# Patient Record
Sex: Male | Born: 1985 | Race: White | Hispanic: No | Marital: Single | Smoking: Current every day smoker
Health system: Southern US, Community
[De-identification: ages and names within clinical notes are randomized; demographics above are authoritative.]

## PROBLEM LIST (undated history)

## (undated) DIAGNOSIS — F209 Schizophrenia, unspecified: Secondary | ICD-10-CM

## (undated) DIAGNOSIS — F419 Anxiety disorder, unspecified: Secondary | ICD-10-CM

## (undated) DIAGNOSIS — F329 Major depressive disorder, single episode, unspecified: Secondary | ICD-10-CM

## (undated) DIAGNOSIS — F32A Depression, unspecified: Secondary | ICD-10-CM

## (undated) DIAGNOSIS — E785 Hyperlipidemia, unspecified: Secondary | ICD-10-CM

---
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
## 2007-07-11 ENCOUNTER — Emergency Department: Payer: Self-pay | Admitting: Emergency Medicine

## 2011-03-10 ENCOUNTER — Emergency Department: Payer: Self-pay | Admitting: Emergency Medicine

## 2011-03-10 LAB — ETHANOL: Ethanol %: 0.166 % — ABNORMAL HIGH (ref 0.000–0.080)

## 2011-03-10 LAB — COMPREHENSIVE METABOLIC PANEL
Albumin: 4.1 g/dL (ref 3.4–5.0)
Alkaline Phosphatase: 51 U/L (ref 50–136)
BUN: 12 mg/dL (ref 7–18)
Bilirubin,Total: 0.3 mg/dL (ref 0.2–1.0)
Creatinine: 0.86 mg/dL (ref 0.60–1.30)
EGFR (African American): >60
Glucose: 92 mg/dL (ref 65–99)
Osmolality: 284 (ref 275–301)
SGPT (ALT): 57 U/L
Sodium: 143 mmol/L (ref 136–145)
Total Protein: 7 g/dL (ref 6.4–8.2)

## 2011-03-10 LAB — CBC WITH DIFFERENTIAL/PLATELET
Basophil %: 0.4 %
Eosinophil #: 0.2 10*3/uL (ref 0.0–0.7)
Eosinophil %: 1.5 %
HCT: 42.3 % (ref 40.0–52.0)
Lymphocyte #: 1.7 10*3/uL (ref 1.0–3.6)
MCH: 30.3 pg (ref 26.0–34.0)
MCHC: 34 g/dL (ref 32.0–36.0)
MCV: 89 fL (ref 80–100)
Monocyte #: 0.7 10*3/uL (ref 0.0–0.7)
Neutrophil %: 76.8 %
Platelet: 143 10*3/uL — ABNORMAL LOW (ref 150–440)
RBC: 4.74 10*6/uL (ref 4.40–5.90)

## 2011-03-10 LAB — DRUG SCREEN, URINE
Barbiturates, Ur Screen: NEGATIVE (ref ?–200)
Benzodiazepine, Ur Scrn: NEGATIVE (ref ?–200)
Cannabinoid 50 Ng, Ur ~~LOC~~: NEGATIVE (ref ?–50)
MDMA (Ecstasy)Ur Screen: NEGATIVE (ref ?–500)
Methadone, Ur Screen: NEGATIVE (ref ?–300)
Opiate, Ur Screen: NEGATIVE (ref ?–300)
Phencyclidine (PCP) Ur S: NEGATIVE (ref ?–25)

## 2011-03-14 ENCOUNTER — Emergency Department: Payer: Self-pay | Admitting: Emergency Medicine

## 2011-03-14 LAB — CBC
HGB: 15.1 g/dL (ref 13.0–18.0)
MCV: 90 fL (ref 80–100)
Platelet: 158 10*3/uL (ref 150–440)
RBC: 4.95 10*6/uL (ref 4.40–5.90)
RDW: 13.7 % (ref 11.5–14.5)
WBC: 10.8 10*3/uL — ABNORMAL HIGH (ref 3.8–10.6)

## 2011-03-14 LAB — COMPREHENSIVE METABOLIC PANEL
Alkaline Phosphatase: 62 U/L (ref 50–136)
Bilirubin,Total: 0.3 mg/dL (ref 0.2–1.0)
Co2: 25 mmol/L (ref 21–32)
EGFR (Non-African Amer.): >60
Glucose: 79 mg/dL (ref 65–99)
Osmolality: 284 (ref 275–301)
SGOT(AST): 40 U/L — ABNORMAL HIGH (ref 15–37)
SGPT (ALT): 46 U/L
Total Protein: 7.4 g/dL (ref 6.4–8.2)

## 2011-03-14 LAB — ETHANOL
Ethanol %: <0.003 % (ref 0.000–0.080)
Ethanol: <3 mg/dL

## 2011-03-14 LAB — DRUG SCREEN, URINE
Amphetamines, Ur Screen: NEGATIVE (ref ?–1000)
Barbiturates, Ur Screen: NEGATIVE (ref ?–200)
Benzodiazepine, Ur Scrn: NEGATIVE (ref ?–200)
Methadone, Ur Screen: NEGATIVE (ref ?–300)
Phencyclidine (PCP) Ur S: NEGATIVE (ref ?–25)
Tricyclic, Ur Screen: NEGATIVE (ref ?–1000)

## 2011-03-14 LAB — TSH: Thyroid Stimulating Horm: 4.33 u[IU]/mL

## 2011-03-15 LAB — URINALYSIS, COMPLETE
Bacteria: NONE SEEN
Bilirubin,UR: NEGATIVE
Glucose,UR: NEGATIVE mg/dL (ref 0–75)
Ketone: NEGATIVE
Leukocyte Esterase: NEGATIVE
RBC,UR: <1 /HPF (ref 0–5)
Squamous Epithelial: NONE SEEN
WBC UR: NONE SEEN /HPF (ref 0–5)

## 2012-07-17 ENCOUNTER — Ambulatory Visit: Payer: Self-pay | Admitting: Internal Medicine

## 2014-03-02 DIAGNOSIS — R7989 Other specified abnormal findings of blood chemistry: Secondary | ICD-10-CM | POA: Insufficient documentation
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
## 2014-05-03 NOTE — Consult Note (Signed)
Pt is [facility]  Full dictation and orders to follow.   Electronic Signatures: Lester CarolinaWilliford, Rande Roylance S (MD) (Signed on 06-Mar-13 15:10)  Authored   Last Updated: 06-Mar-13 23:51 by Lester CarolinaWilliford, Phelan Schadt S (MD)
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
## 2014-11-03 IMAGING — US ABDOMEN ULTRASOUND LIMITED
1 series · 14 of 25 positions shown · non-contrast
Comparison: 

REASON FOR EXAM: RUQ  liver elevated liver enzymes
COMMENTS:

PROCEDURE:     US  - US ABDOMEN LIMITED SURVEY  - July 17, 2012 [DATE]
RESULT:     Right or quadrant abdominal ultrasound dated
TECHNIQUE: Real time sonographic imaging of the right quadrant was obtained.
Static representative images were provided for interpretation.

[Series 1: abdomen ultrasound limited · 0.33mm/px · 14 of 56 slices shown]
[im 1/56]
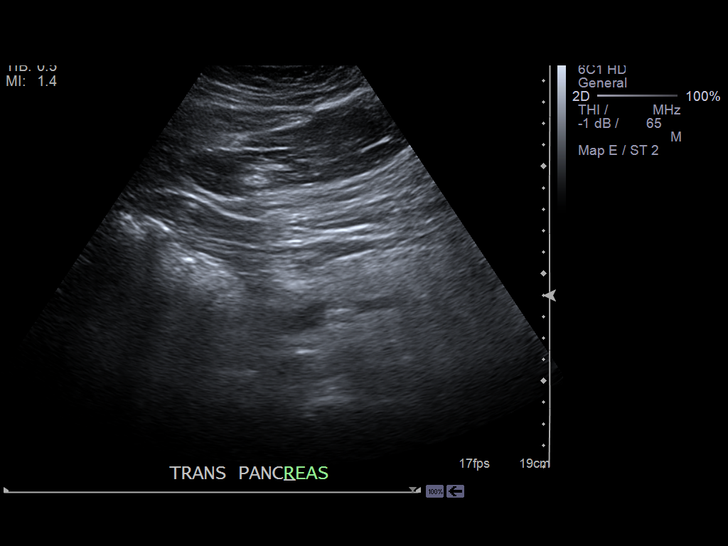
[im 5/56]
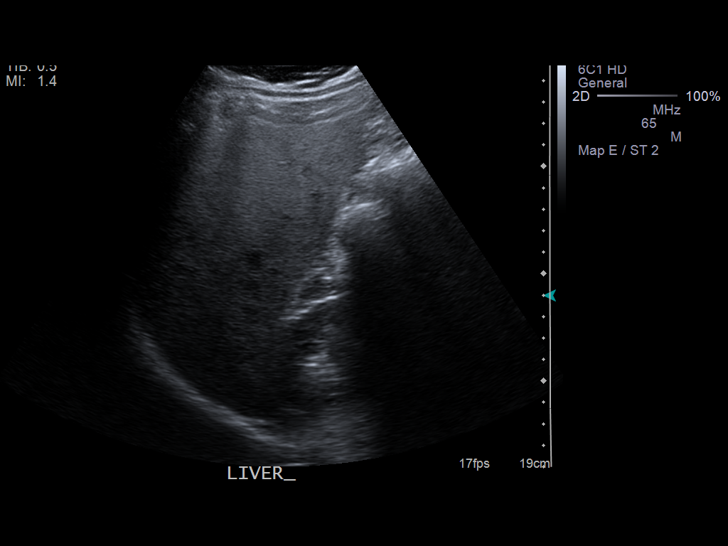
[im 10/56]
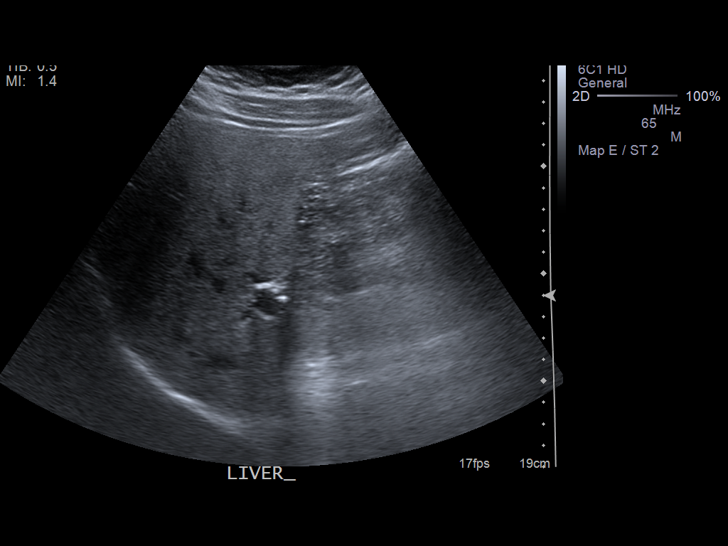
[im 14/56]
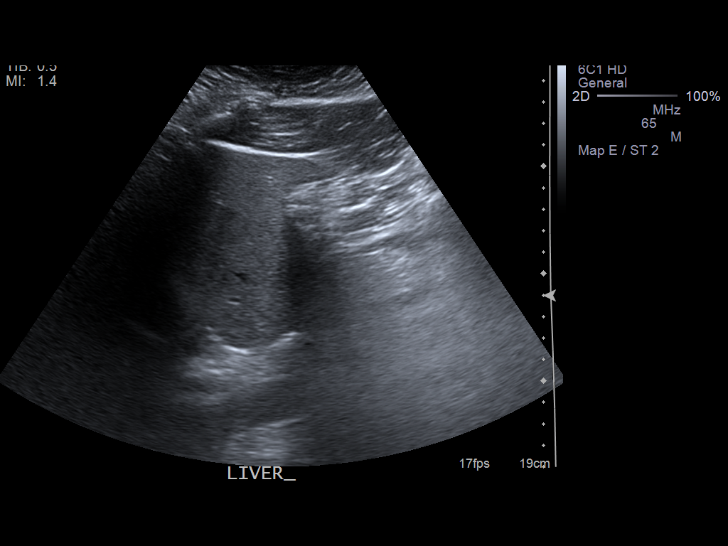
[im 19/56]
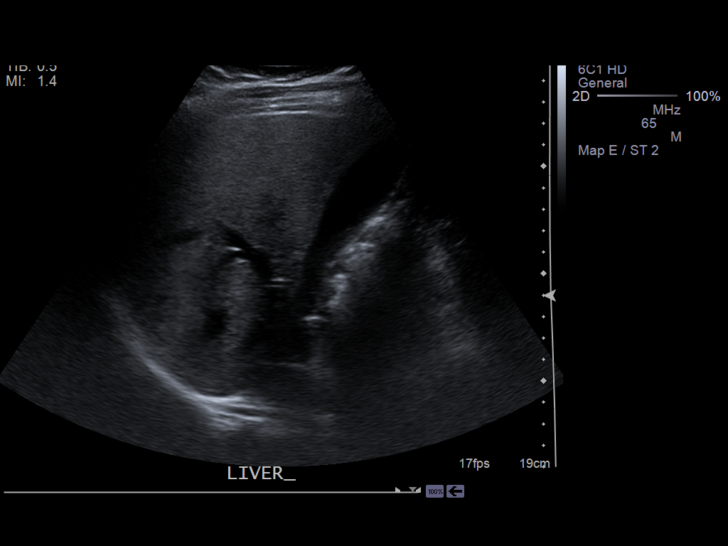
[im 21/56]
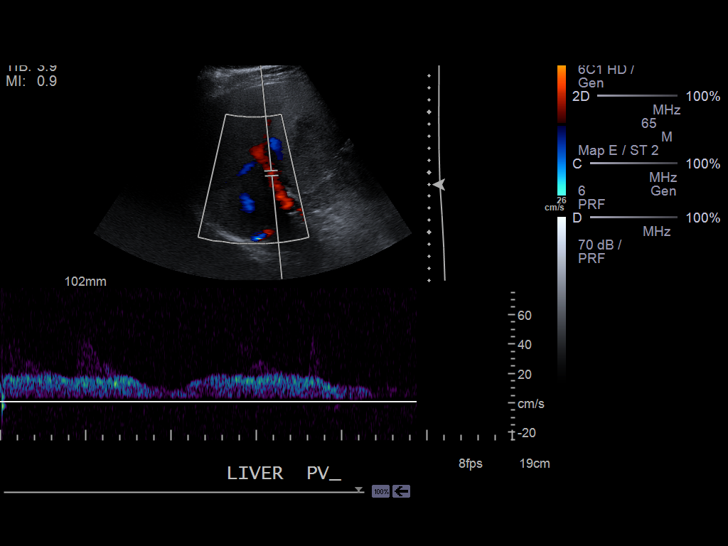
[im 26/56]
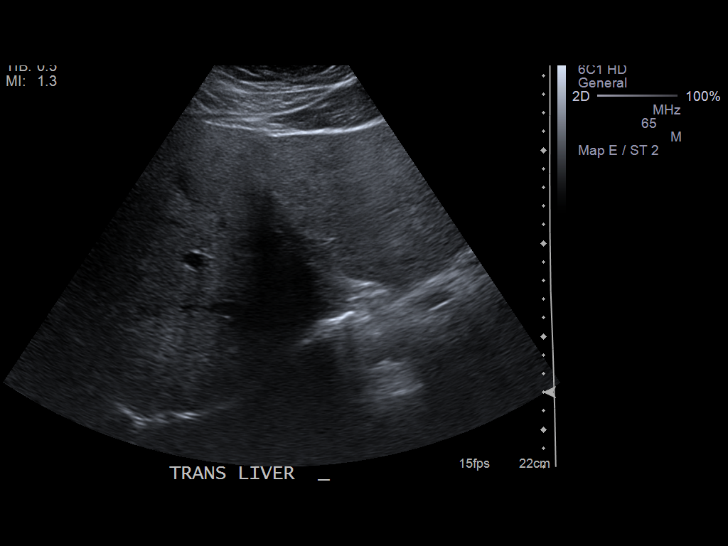
[im 30/56]
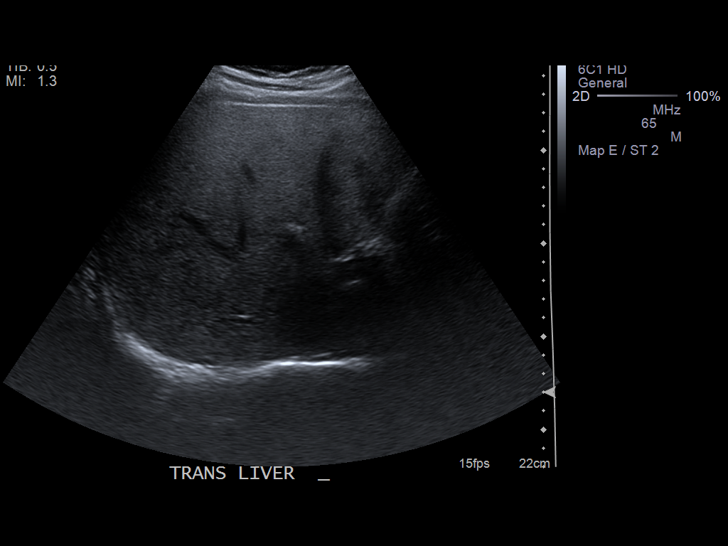
[im 35/56]
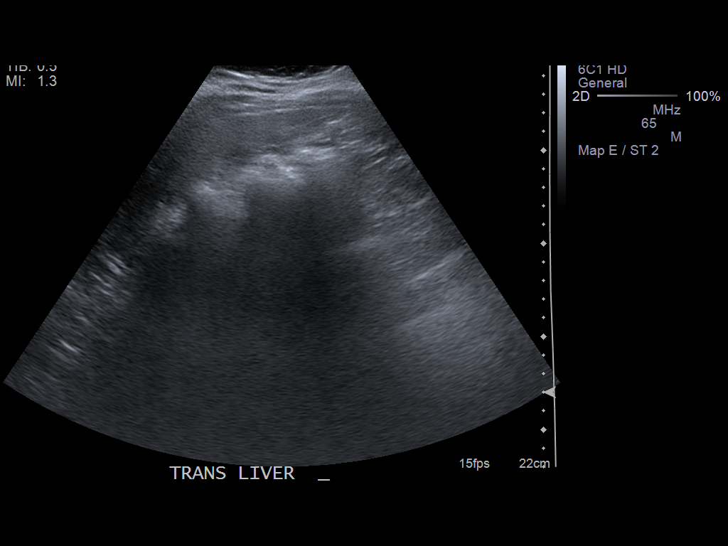
[im 37/56]
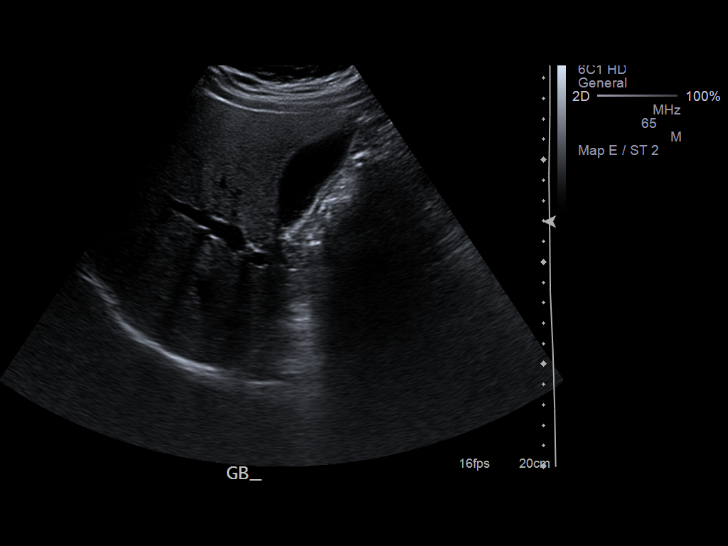
[im 42/56]
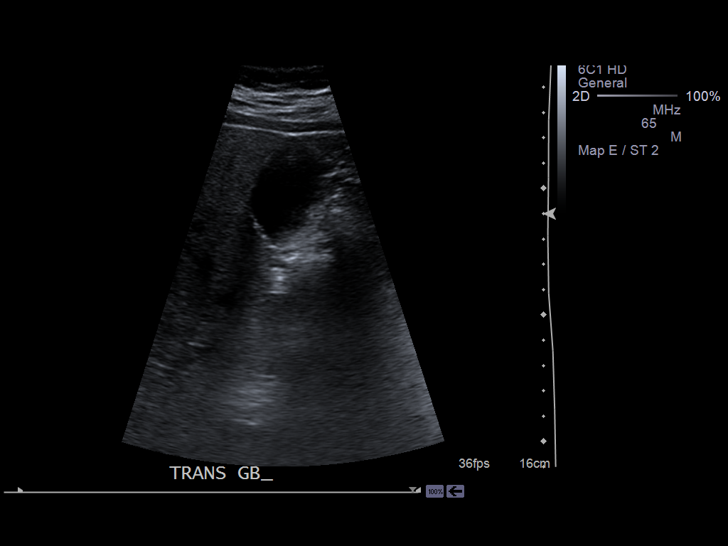
[im 46/56]
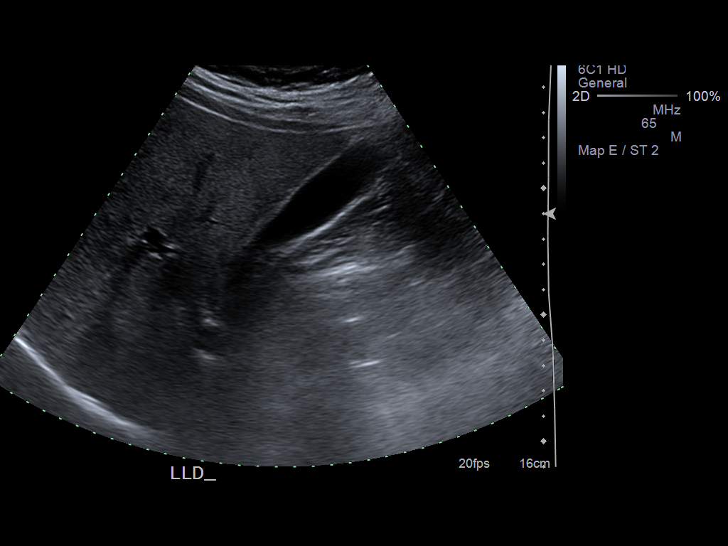
[im 51/56]
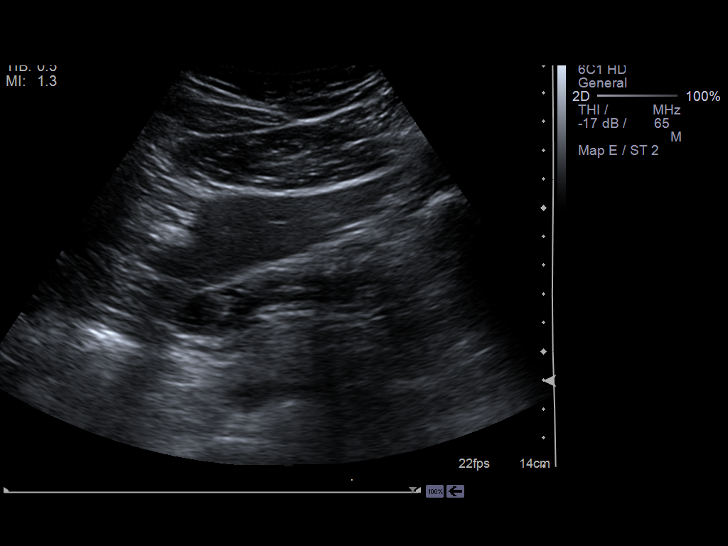
[im 56/56]
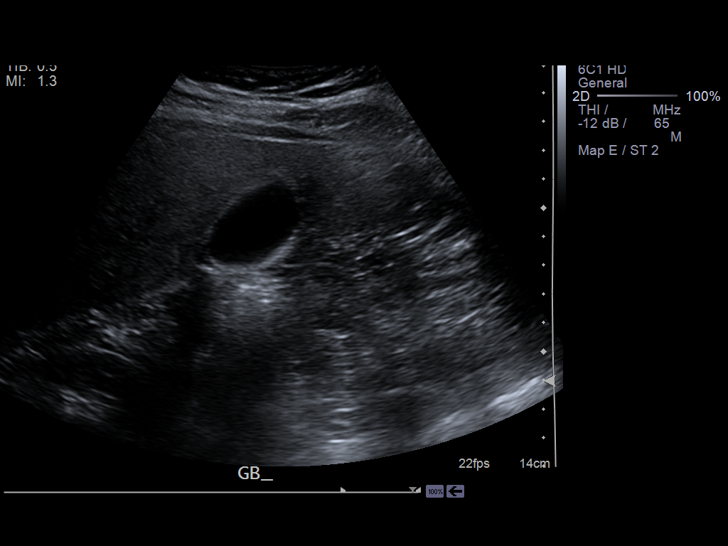

[14 of 25 positions shown; findings below may reference images not displayed]

FINDINGS: The liver is unremarkable. Hepatopedal flow is identified within
the portal vein.

There is no evidence of pericholecystic fluid, gallstones, sludging,
gallbladder wall thickening, nor common bile duct dilation. The gallbladder
wall thickness is  1.7 mm and the common bile duct measures  3.4 mm in
diameter. The technologist was unable to elicit a sonographic Murphy's sign.
The pancreas is unremarkable.
IMPRESSION: No sonographic evidence of cholecystitis nor cholelithiasis.

## 2015-08-24 ENCOUNTER — Emergency Department
Admission: EM | Admit: 2015-08-24 | Discharge: 2015-08-24 | Disposition: A | Discharge status: Home / Self Care | Payer: No Typology Code available for payment source | Attending: Emergency Medicine | Admitting: Emergency Medicine

## 2015-08-24 ENCOUNTER — Emergency Department: Payer: No Typology Code available for payment source

## 2015-08-24 ENCOUNTER — Encounter: Payer: Self-pay | Admitting: Emergency Medicine

## 2015-08-24 DIAGNOSIS — Y9355 Activity, bike riding: Secondary | ICD-10-CM | POA: Diagnosis not present

## 2015-08-24 DIAGNOSIS — F17200 Nicotine dependence, unspecified, uncomplicated: Secondary | ICD-10-CM | POA: Insufficient documentation

## 2015-08-24 DIAGNOSIS — S60021A Contusion of right index finger without damage to nail, initial encounter: Secondary | ICD-10-CM | POA: Diagnosis not present

## 2015-08-24 DIAGNOSIS — Y92410 Unspecified street and highway as the place of occurrence of the external cause: Secondary | ICD-10-CM | POA: Insufficient documentation

## 2015-08-24 DIAGNOSIS — Y999 Unspecified external cause status: Secondary | ICD-10-CM | POA: Insufficient documentation

## 2015-08-24 DIAGNOSIS — S6991XA Unspecified injury of right wrist, hand and finger(s), initial encounter: Secondary | ICD-10-CM | POA: Diagnosis present

## 2015-08-24 DIAGNOSIS — S6000XA Contusion of unspecified finger without damage to nail, initial encounter: Secondary | ICD-10-CM

## 2015-08-24 MED ORDER — IBUPROFEN 800 MG PO TABS: 800.0000 mg | ORAL_TABLET | Freq: Three times a day (TID) | ORAL | 0 refills | Status: DC | PRN
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
## 2019-04-10 DIAGNOSIS — F250 Schizoaffective disorder, bipolar type: Secondary | ICD-10-CM | POA: Insufficient documentation

## 2019-04-10 DIAGNOSIS — J449 Chronic obstructive pulmonary disease, unspecified: Secondary | ICD-10-CM | POA: Insufficient documentation

## 2019-04-10 DIAGNOSIS — F418 Other specified anxiety disorders: Secondary | ICD-10-CM | POA: Insufficient documentation

## 2019-04-10 DIAGNOSIS — F259 Schizoaffective disorder, unspecified: Secondary | ICD-10-CM | POA: Insufficient documentation

## 2019-04-10 DIAGNOSIS — K760 Fatty (change of) liver, not elsewhere classified: Secondary | ICD-10-CM | POA: Insufficient documentation

## 2019-04-10 DIAGNOSIS — E785 Hyperlipidemia, unspecified: Secondary | ICD-10-CM | POA: Insufficient documentation

## 2019-04-10 DIAGNOSIS — G4733 Obstructive sleep apnea (adult) (pediatric): Secondary | ICD-10-CM | POA: Insufficient documentation

## 2019-04-11 ENCOUNTER — Inpatient Hospital Stay: Payer: Medicare Other | Attending: Oncology | Admitting: Oncology

## 2019-04-11 ENCOUNTER — Encounter: Payer: Self-pay | Admitting: Oncology

## 2019-04-11 ENCOUNTER — Other Ambulatory Visit: Payer: Self-pay

## 2019-04-11 ENCOUNTER — Inpatient Hospital Stay: Payer: Medicare Other

## 2019-04-11 ENCOUNTER — Encounter (INDEPENDENT_AMBULATORY_CARE_PROVIDER_SITE_OTHER): Payer: Self-pay

## 2019-04-11 VITALS — BP 126/76 | HR 88 | Temp 97.8°F | Resp 20 | Wt 321.4 lb

## 2019-04-11 DIAGNOSIS — D72829 Elevated white blood cell count, unspecified: Secondary | ICD-10-CM | POA: Diagnosis present

## 2019-04-11 DIAGNOSIS — F17200 Nicotine dependence, unspecified, uncomplicated: Secondary | ICD-10-CM | POA: Insufficient documentation

## 2019-04-11 DIAGNOSIS — Z79899 Other long term (current) drug therapy: Secondary | ICD-10-CM | POA: Insufficient documentation

## 2019-04-11 DIAGNOSIS — R0609 Other forms of dyspnea: Secondary | ICD-10-CM | POA: Diagnosis not present

## 2019-04-11 DIAGNOSIS — R7889 Finding of other specified substances, not normally found in blood: Secondary | ICD-10-CM | POA: Diagnosis not present

## 2019-04-11 DIAGNOSIS — D751 Secondary polycythemia: Secondary | ICD-10-CM | POA: Diagnosis not present

## 2019-04-11 DIAGNOSIS — R0602 Shortness of breath: Secondary | ICD-10-CM | POA: Insufficient documentation

## 2019-04-11 LAB — FERRITIN: Ferritin: 297 ng/mL (ref 24–336)

## 2019-04-11 LAB — IRON AND TIBC
Iron: 107 ug/dL (ref 45–182)
Saturation Ratios: 27 % (ref 17.9–39.5)
TIBC: 391 ug/dL (ref 250–450)
UIBC: 284 ug/dL

## 2019-04-11 LAB — CBC
HCT: 49.9 % (ref 39.0–52.0)
Hemoglobin: 17.6 g/dL — ABNORMAL HIGH (ref 13.0–17.0)
MCH: 31.3 pg (ref 26.0–34.0)
MCHC: 35.3 g/dL (ref 30.0–36.0)
MCV: 88.6 fL (ref 80.0–100.0)
Platelets: 163 10*3/uL (ref 150–400)
RBC: 5.63 MIL/uL (ref 4.22–5.81)
RDW: 12.2 % (ref 11.5–15.5)
WBC: 11.2 10*3/uL — ABNORMAL HIGH (ref 4.0–10.5)
nRBC: 0 % (ref 0.0–0.2)

## 2019-04-11 NOTE — Progress Notes (Signed)
[place]  Telephone:(336) [phone] Fax:(336) [phone]  ID: Jeffrey Velasquez OB: [birth year]  MR#: [id number]  IOE#:[id number]  Patient Care Team: Jodi Marble, MD as PCP - General (Internal Medicine)  CHIEF COMPLAINT: Polycythemia, leukocytosis.  INTERVAL HISTORY: Patient is a 34 year old male who was noted to have a hemoglobin that was trending up as well as a persistently elevated white blood cell count.  He has chronic shortness of breath and dyspnea on exertion, but otherwise feels well.  He has no neurologic complaints.  He denies any recent fevers or illnesses.  He has a good appetite and denies weight loss.  He has no chest pain, cough, or hemoptysis.  He denies any nausea, vomiting, constipation, or diarrhea.  He has no urinary complaints.  Patient feels at his baseline and offers no further specific complaints today.  REVIEW OF SYSTEMS:   Review of Systems  Constitutional: Negative.  Negative for fever, malaise/fatigue and weight loss.  Respiratory: Positive for shortness of breath. Negative for cough and hemoptysis.   Cardiovascular: Negative.  Negative for chest pain and leg swelling.  Gastrointestinal: Negative.  Negative for abdominal pain, blood in stool and melena.  Genitourinary: Negative.  Negative for dysuria.  Musculoskeletal: Negative.   Skin: Negative.  Negative for rash.  Neurological: Negative.  Negative for dizziness, focal weakness, weakness and headaches.  Psychiatric/Behavioral: Negative.  The patient is not nervous/anxious.     As per HPI. Otherwise, a complete review of systems is negative.  PAST MEDICAL HISTORY: History reviewed. No pertinent past medical history.  PAST SURGICAL HISTORY: History reviewed. No pertinent surgical history.  FAMILY HISTORY: History reviewed. No pertinent family history.  ADVANCED DIRECTIVES (Y/N):  N  HEALTH MAINTENANCE: Social History   Tobacco Use  . Smoking status: Current Every  Day Smoker  . Smokeless tobacco: Never Used  Substance Use Topics  . Alcohol use: Yes  . Drug use: Not on file     Colonoscopy:  PAP:  Bone density:  Lipid panel:  Allergies  Allergen Reactions  . Clonazepam   . Shellfish Allergy     Current Outpatient Medications  Medication Sig Dispense Refill  . albuterol (VENTOLIN HFA) 108 (90 Base) MCG/ACT inhaler Inhale 2 puffs into the lungs every 4 (four) hours as needed.    . ARIPiprazole ER (ABILIFY MAINTENA) 300 MG PRSY prefilled syringe Inject 300 mg into the muscle every 30 (thirty) days.    . cetirizine (ZYRTEC) 10 MG tablet Take 10 mg by mouth daily.    . Cholecalciferol (VITAMIN D3) 1.25 MG (50000 UT) CAPS Take 1 capsule by mouth once a week.    . hydrOXYzine (VISTARIL) 50 MG capsule Take 50 mg by mouth 3 (three) times daily.    Marland Kitchen icosapent Ethyl (VASCEPA) 1 g capsule Take 2 g by mouth 2 (two) times daily.    . rosuvastatin (CRESTOR) 20 MG tablet Take 20 mg by mouth daily.    . budesonide-formoterol (SYMBICORT) 160-4.5 MCG/ACT inhaler Inhale 2 puffs into the lungs 2 (two) times daily.     No current facility-administered medications for this visit.    OBJECTIVE: Vitals:   04/11/19 1121  BP: 126/76  Pulse: 88  Resp: 20  Temp: 97.8 F (36.6 C)  SpO2: 98%     Body mass index is 44.83 kg/m.    ECOG FS:0 - Asymptomatic  General: Well-developed, well-nourished, no acute distress. Eyes: Pink conjunctiva, anicteric sclera. HEENT: Normocephalic, moist mucous membranes. Lungs: No audible wheezing or coughing. Heart:  Regular rate and rhythm. Abdomen: Soft, nontender, no obvious distention. Musculoskeletal: No edema, cyanosis, or clubbing. Neuro: Alert, answering all questions appropriately. Cranial nerves grossly intact. Skin: No rashes or petechiae noted. Psych: Normal affect. Lymphatics: No cervical, calvicular, axillary or inguinal LAD.   LAB RESULTS:  Lab Results  Component Value Date   NA 143 03/14/2011   K 3.8  03/14/2011   CL 107 03/14/2011   CO2 25 03/14/2011   GLUCOSE 79 03/14/2011   BUN 12 03/14/2011   CREATININE 1.02 03/14/2011   CALCIUM 9.0 03/14/2011   PROT 7.4 03/14/2011   ALBUMIN 4.2 03/14/2011   AST 40 (H) 03/14/2011   ALT 46 03/14/2011   ALKPHOS 62 03/14/2011   BILITOT 0.3 03/14/2011   GFRNONAA >60 03/14/2011   GFRAA >60 03/14/2011    Lab Results  Component Value Date   WBC 11.2 (H) 04/11/2019   NEUTROABS 8.8 (H) 03/10/2011   HGB 17.6 (H) 04/11/2019   HCT 49.9 04/11/2019   MCV 88.6 04/11/2019   PLT 163 04/11/2019     STUDIES: No results found.  ASSESSMENT: Polycythemia, leukocytosis.  PLAN:    1.  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  Iron panel, erythropoietin level, JAK2 mutation, carbon monoxide level, and hemochromatosis mutation have been ordered for completeness and are pending at time of dictation.  Given patient's hemoglobin is greater than 17.0, he will return to clinic in 2 weeks for discussion of his laboratory work and initiation of phlebotomy. 2.  Leukocytosis: Chronic and unchanged.  Laboratory work as above.  In addition flow cytometry as well as BCR-ABL mutation are pending at time of dictation.  No bone marrow biopsy is needed at this time.  Follow-up as above.  I spent a total of 45 minutes reviewing chart data, face-to-face evaluation with the patient, counseling and coordination of care as detailed above.   Patient expressed understanding and was in agreement with this plan. He also understands that He can call clinic at any time with any questions, concerns, or complaints.    Lloyd Huger, MD   04/11/2019 1:49 PM

## 2019-04-11 NOTE — Progress Notes (Signed)
New patient denies pain and would like to discuss recent diagnosis. States he has some swelling in ankles and is a current every day smoker.

## 2019-04-12 LAB — ERYTHROPOIETIN: Erythropoietin: 5.8 m[IU]/mL (ref 2.6–18.5)

## 2019-04-15 LAB — COMP PANEL: LEUKEMIA/LYMPHOMA

## 2019-04-15 LAB — CARBON MONOXIDE, BLOOD (PERFORMED AT REF LAB): Carbon Monoxide, Blood: 10.2 % — ABNORMAL HIGH (ref 0.0–3.6)

## 2019-04-15 LAB — JAK2 GENOTYPR

## 2019-04-16 LAB — HEMOCHROMATOSIS DNA-PCR(C282Y,H63D)

## 2019-04-17 LAB — BCR-ABL1, CML/ALL, PCR, QUANT: Interpretation (BCRAL):: NEGATIVE

## 2019-04-18 NOTE — Progress Notes (Signed)
[place]  Telephone:(336) [phone] Fax:(336) [phone]  ID: MOHD CLEMONS OB: 1985-01-20  MR#: [id number]  AOZ#:[id number]  Patient Care Team: Jodi Marble, MD as PCP - General (Internal Medicine)  CHIEF COMPLAINT: Polycythemia, leukocytosis.  INTERVAL HISTORY: Patient returns to clinic today for further evaluation, discussion of his laboratory results, and initiation of phlebotomy.  He continues to have chronic shortness of breath and dyspnea on exertion, but otherwise feels well.  He has no neurologic complaints.  He denies any recent fevers or illnesses.  He has a good appetite and denies weight loss.  He has no chest pain, cough, or hemoptysis.  He denies any nausea, vomiting, constipation, or diarrhea.  He has no urinary complaints.  Patient offers no further specific complaints today.  REVIEW OF SYSTEMS:   Review of Systems  Constitutional: Negative.  Negative for fever, malaise/fatigue and weight loss.  Respiratory: Positive for shortness of breath. Negative for cough and hemoptysis.   Cardiovascular: Negative.  Negative for chest pain and leg swelling.  Gastrointestinal: Negative.  Negative for abdominal pain, blood in stool and melena.  Genitourinary: Negative.  Negative for dysuria.  Musculoskeletal: Negative.   Skin: Negative.  Negative for rash.  Neurological: Negative.  Negative for dizziness, focal weakness, weakness and headaches.  Psychiatric/Behavioral: Negative.  The patient is not nervous/anxious.     As per HPI. Otherwise, a complete review of systems is negative.  PAST MEDICAL HISTORY: History reviewed. No pertinent past medical history.  PAST SURGICAL HISTORY: History reviewed. No pertinent surgical history.  FAMILY HISTORY: History reviewed. No pertinent family history.  ADVANCED DIRECTIVES (Y/N):  N  HEALTH MAINTENANCE: Social History   Tobacco Use  . Smoking status: Current Every Day Smoker  . Smokeless tobacco:  Never Used  Substance Use Topics  . Alcohol use: Yes  . Drug use: Not on file     Colonoscopy:  PAP:  Bone density:  Lipid panel:  Allergies  Allergen Reactions  . Clonazepam   . Shellfish Allergy     Current Outpatient Medications  Medication Sig Dispense Refill  . albuterol (VENTOLIN HFA) 108 (90 Base) MCG/ACT inhaler Inhale 2 puffs into the lungs every 4 (four) hours as needed.    . ARIPiprazole ER (ABILIFY MAINTENA) 300 MG PRSY prefilled syringe Inject 300 mg into the muscle every 30 (thirty) days.    . budesonide-formoterol (SYMBICORT) 160-4.5 MCG/ACT inhaler Inhale 2 puffs into the lungs 2 (two) times daily.    . cetirizine (ZYRTEC) 10 MG tablet Take 10 mg by mouth daily.    . Cholecalciferol (VITAMIN D3) 1.25 MG (50000 UT) CAPS Take 1 capsule by mouth once a week.    . hydrOXYzine (VISTARIL) 50 MG capsule Take 50 mg by mouth 3 (three) times daily.    Marland Kitchen icosapent Ethyl (VASCEPA) 1 g capsule Take 2 g by mouth 2 (two) times daily.    . rosuvastatin (CRESTOR) 20 MG tablet Take 20 mg by mouth daily.     No current facility-administered medications for this visit.    OBJECTIVE: Vitals:   04/25/19 1412  BP: 122/70  Pulse: 78  Resp: 20  Temp: 97.6 F (36.4 C)  SpO2: 98%     Body mass index is 44.81 kg/m.    ECOG FS:0 - Asymptomatic  General: Well-developed, well-nourished, no acute distress. Eyes: Pink conjunctiva, anicteric sclera. HEENT: Normocephalic, moist mucous membranes. Lungs: No audible wheezing or coughing. Heart: Regular rate and rhythm. Abdomen: Soft, nontender, no obvious distention. Musculoskeletal: No  edema, cyanosis, or clubbing. Neuro: Alert, answering all questions appropriately. Cranial nerves grossly intact. Skin: No rashes or petechiae noted. Psych: Normal affect.   LAB RESULTS:  Lab Results  Component Value Date   NA 143 03/14/2011   K 3.8 03/14/2011   CL 107 03/14/2011   CO2 25 03/14/2011   GLUCOSE 79 03/14/2011   BUN 12 03/14/2011    CREATININE 1.02 03/14/2011   CALCIUM 9.0 03/14/2011   PROT 7.4 03/14/2011   ALBUMIN 4.2 03/14/2011   AST 40 (H) 03/14/2011   ALT 46 03/14/2011   ALKPHOS 62 03/14/2011   BILITOT 0.3 03/14/2011   GFRNONAA >60 03/14/2011   GFRAA >60 03/14/2011    Lab Results  Component Value Date   WBC 11.2 (H) 04/11/2019   NEUTROABS 8.8 (H) 03/10/2011   HGB 17.6 (H) 04/11/2019   HCT 49.9 04/11/2019   MCV 88.6 04/11/2019   PLT 163 04/11/2019     STUDIES: No results found.  ASSESSMENT: Polycythemia, leukocytosis.  PLAN:    1.  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  Patient's carbon monoxide level is greater than 10%.  All of his other laboratory work including iron panel, erythropoietin level, JAK2 mutation and hemochromatosis mutation are all either negative or within normal limits.  Goal hemoglobin will be less than 17.0.  Proceed with phlebotomy today.  Return to clinic in 2 months with laboratory work and phlebotomy if needed and then in 4 months with laboratory work, further evaluation, and continuation of treatment.  2.  Leukocytosis: Chronic and unchanged.  Also likely secondary to chronic tobacco use.  Peripheral blood flow cytometry and BCR-ABL mutation are negative.  Patient does not require bone marrow biopsy at this time.  Monitor.    I spent a total of 30 minutes reviewing chart data, face-to-face evaluation with the patient, counseling and coordination of care as detailed above.   Patient expressed understanding and was in agreement with this plan. He also understands that He can call clinic at any time with any questions, concerns, or complaints.    Lloyd Huger, MD   04/26/2019 12:27 PM

## 2019-04-25 ENCOUNTER — Inpatient Hospital Stay: Payer: Medicare Other

## 2019-04-25 ENCOUNTER — Other Ambulatory Visit: Payer: Self-pay

## 2019-04-25 ENCOUNTER — Inpatient Hospital Stay (HOSPITAL_BASED_OUTPATIENT_CLINIC_OR_DEPARTMENT_OTHER): Payer: Medicare Other | Admitting: Oncology

## 2019-04-25 ENCOUNTER — Encounter: Payer: Self-pay | Admitting: Oncology

## 2019-04-25 VITALS — BP 122/70 | HR 78 | Temp 97.6°F | Resp 20 | Wt 321.3 lb

## 2019-04-25 DIAGNOSIS — D72829 Elevated white blood cell count, unspecified: Secondary | ICD-10-CM

## 2019-04-25 DIAGNOSIS — D751 Secondary polycythemia: Secondary | ICD-10-CM

## 2019-04-25 NOTE — Progress Notes (Signed)
Pt here for phlebotomy per Dr. Orlie Dakin.  PIV inserted to right AC with brisk blood return.  of blood collected from patient per MD orders.  Pt tolerated procedure well and drank sprite afterwards.  Left chemo suite stable and ambulatory.

## 2019-04-25 NOTE — Progress Notes (Signed)
Patient states he is not having any pain or concerns today. Patient states that he is trying to increase physical activity more. He states his swelling in feet has gotten some better.

## 2019-06-24 ENCOUNTER — Other Ambulatory Visit: Payer: Self-pay

## 2019-06-24 ENCOUNTER — Encounter: Payer: Self-pay | Admitting: Emergency Medicine

## 2019-06-24 ENCOUNTER — Emergency Department
Admission: EM | Admit: 2019-06-24 | Discharge: 2019-06-25 | Disposition: A | Discharge status: Home / Self Care | Payer: Medicare Other | Attending: Emergency Medicine | Admitting: Emergency Medicine

## 2019-06-24 DIAGNOSIS — F10129 Alcohol abuse with intoxication, unspecified: Secondary | ICD-10-CM | POA: Diagnosis not present

## 2019-06-24 DIAGNOSIS — Y900 Blood alcohol level of less than 20 mg/100 ml: Secondary | ICD-10-CM | POA: Diagnosis not present

## 2019-06-24 DIAGNOSIS — J449 Chronic obstructive pulmonary disease, unspecified: Secondary | ICD-10-CM | POA: Diagnosis present

## 2019-06-24 DIAGNOSIS — F259 Schizoaffective disorder, unspecified: Secondary | ICD-10-CM | POA: Diagnosis present

## 2019-06-24 DIAGNOSIS — R7989 Other specified abnormal findings of blood chemistry: Secondary | ICD-10-CM | POA: Diagnosis present

## 2019-06-24 DIAGNOSIS — F17210 Nicotine dependence, cigarettes, uncomplicated: Secondary | ICD-10-CM | POA: Diagnosis not present

## 2019-06-24 DIAGNOSIS — R4689 Other symptoms and signs involving appearance and behavior: Secondary | ICD-10-CM

## 2019-06-24 DIAGNOSIS — F250 Schizoaffective disorder, bipolar type: Secondary | ICD-10-CM | POA: Diagnosis not present

## 2019-06-24 DIAGNOSIS — Z79899 Other long term (current) drug therapy: Secondary | ICD-10-CM | POA: Diagnosis not present

## 2019-06-24 DIAGNOSIS — K760 Fatty (change of) liver, not elsewhere classified: Secondary | ICD-10-CM | POA: Diagnosis present

## 2019-06-24 DIAGNOSIS — F418 Other specified anxiety disorders: Secondary | ICD-10-CM | POA: Diagnosis present

## 2019-06-24 DIAGNOSIS — D751 Secondary polycythemia: Secondary | ICD-10-CM | POA: Diagnosis present

## 2019-06-24 DIAGNOSIS — E785 Hyperlipidemia, unspecified: Secondary | ICD-10-CM | POA: Diagnosis present

## 2019-06-24 DIAGNOSIS — F1010 Alcohol abuse, uncomplicated: Secondary | ICD-10-CM

## 2019-06-24 DIAGNOSIS — D72829 Elevated white blood cell count, unspecified: Secondary | ICD-10-CM | POA: Diagnosis present

## 2019-06-24 DIAGNOSIS — G4733 Obstructive sleep apnea (adult) (pediatric): Secondary | ICD-10-CM | POA: Diagnosis present

## 2019-06-24 HISTORY — DX: Hyperlipidemia, unspecified: E78.5

## 2019-06-24 HISTORY — DX: Depression, unspecified: F32.A

## 2019-06-24 HISTORY — DX: Schizophrenia, unspecified: F20.9

## 2019-06-24 HISTORY — DX: Anxiety disorder, unspecified: F41.9

## 2019-06-24 LAB — COMPREHENSIVE METABOLIC PANEL
ALT: 115 U/L — ABNORMAL HIGH (ref 0–44)
AST: 139 U/L — ABNORMAL HIGH (ref 15–41)
Albumin: 4.7 g/dL (ref 3.5–5.0)
Alkaline Phosphatase: 54 U/L (ref 38–126)
Anion gap: 11 (ref 5–15)
BUN: 11 mg/dL (ref 6–20)
CO2: 23 mmol/L (ref 22–32)
Calcium: 9.8 mg/dL (ref 8.9–10.3)
Chloride: 105 mmol/L (ref 98–111)
Creatinine, Ser: 0.98 mg/dL (ref 0.61–1.24)
GFR calc Af Amer: >60 mL/min (ref >60)
GFR calc non Af Amer: >60 mL/min (ref >60)
Glucose, Bld: 94 mg/dL (ref 70–99)
Potassium: 4.2 mmol/L (ref 3.5–5.1)
Sodium: 139 mmol/L (ref 135–145)
Total Bilirubin: 0.7 mg/dL (ref 0.3–1.2)
Total Protein: 7.7 g/dL (ref 6.5–8.1)

## 2019-06-24 LAB — CBC
HCT: 51.5 % (ref 39.0–52.0)
Hemoglobin: 18 g/dL — ABNORMAL HIGH (ref 13.0–17.0)
MCH: 31.3 pg (ref 26.0–34.0)
MCHC: 35 g/dL (ref 30.0–36.0)
MCV: 89.4 fL (ref 80.0–100.0)
Platelets: 183 10*3/uL (ref 150–400)
RBC: 5.76 MIL/uL (ref 4.22–5.81)
RDW: 12.5 % (ref 11.5–15.5)
WBC: 14.5 10*3/uL — ABNORMAL HIGH (ref 4.0–10.5)
nRBC: 0 % (ref 0.0–0.2)

## 2019-06-24 LAB — URINE DRUG SCREEN, QUALITATIVE (ARMC ONLY)
Amphetamines, Ur Screen: NOT DETECTED
Barbiturates, Ur Screen: NOT DETECTED
Benzodiazepine, Ur Scrn: NOT DETECTED
Cannabinoid 50 Ng, Ur ~~LOC~~: NOT DETECTED
Cocaine Metabolite,Ur ~~LOC~~: NOT DETECTED
MDMA (Ecstasy)Ur Screen: NOT DETECTED
Methadone Scn, Ur: NOT DETECTED
Opiate, Ur Screen: NOT DETECTED
Phencyclidine (PCP) Ur S: NOT DETECTED
Tricyclic, Ur Screen: NOT DETECTED

## 2019-06-24 LAB — ACETAMINOPHEN LEVEL: Acetaminophen (Tylenol), Serum: <10 ug/mL — ABNORMAL LOW (ref 10–30)

## 2019-06-24 LAB — ETHANOL: Alcohol, Ethyl (B): 18 mg/dL — ABNORMAL HIGH (ref <10)

## 2019-06-24 LAB — SALICYLATE LEVEL: Salicylate Lvl: <7 mg/dL — ABNORMAL LOW (ref 7.0–30.0)
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
## 2019-06-24 NOTE — ED Notes (Signed)
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
## 2019-06-25 ENCOUNTER — Inpatient Hospital Stay: Payer: Medicare Other | Attending: Oncology

## 2019-06-25 ENCOUNTER — Inpatient Hospital Stay: Payer: Medicare Other

## 2019-06-25 ENCOUNTER — Other Ambulatory Visit: Payer: Self-pay | Admitting: Emergency Medicine

## 2019-06-25 VITALS — BP 128/90 | HR 92 | Resp 20

## 2019-06-25 DIAGNOSIS — F250 Schizoaffective disorder, bipolar type: Secondary | ICD-10-CM | POA: Diagnosis not present

## 2019-06-25 DIAGNOSIS — D751 Secondary polycythemia: Secondary | ICD-10-CM

## 2019-06-25 LAB — CBC WITH DIFFERENTIAL/PLATELET
Abs Immature Granulocytes: 0.06 10*3/uL (ref 0.00–0.07)
Basophils Absolute: 0.1 10*3/uL (ref 0.0–0.1)
Basophils Relative: 1 %
Eosinophils Absolute: 0.6 10*3/uL — ABNORMAL HIGH (ref 0.0–0.5)
Eosinophils Relative: 5 %
HCT: 51.2 % (ref 39.0–52.0)
Hemoglobin: 17.7 g/dL — ABNORMAL HIGH (ref 13.0–17.0)
Immature Granulocytes: 1 %
Lymphocytes Relative: 26 %
Lymphs Abs: 3.1 10*3/uL (ref 0.7–4.0)
MCH: 30.9 pg (ref 26.0–34.0)
MCHC: 34.6 g/dL (ref 30.0–36.0)
MCV: 89.4 fL (ref 80.0–100.0)
Monocytes Absolute: 0.5 10*3/uL (ref 0.1–1.0)
Monocytes Relative: 4 %
Neutro Abs: 7.6 10*3/uL (ref 1.7–7.7)
Neutrophils Relative %: 63 %
Platelets: 179 10*3/uL (ref 150–400)
RBC: 5.73 MIL/uL (ref 4.22–5.81)
RDW: 12.5 % (ref 11.5–15.5)
WBC: 11.9 10*3/uL — ABNORMAL HIGH (ref 4.0–10.5)
nRBC: 0 % (ref 0.0–0.2)

## 2019-06-25 NOTE — Progress Notes (Signed)
Multiple nurses attempted IV access for phlebotomy and was unsuccessful. MD made aware and pt rescheduled. Pt updated.   Jeffrey Velasquez [employer]
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
## 2019-06-25 NOTE — ED Provider Notes (Signed)
-----------------------------------------   3:18 AM on 06/25/2019 -----------------------------------------  Patient was evaluated by psychiatric NP who recommends discharge back to boarding home in the morning.   ----------------------------------------- 7:08 AM on 06/25/2019 -----------------------------------------  Patient resting no acute distress.  Will discharge back to boarding home this am. Strict return precautions given.  Patient verbalizes understanding agrees with plan of care.   Irean Hong, MD 06/25/19 [phone]

## 2019-06-25 NOTE — Final Progress Note (Signed)
Physician Final Progress Note  Patient ID: [name] MRN: [id number] DOB/AGE: 15-Aug-1985 33 y.o.  Admit date: 06/24/2019 Admitting provider: No admitting provider for patient encounter. Discharge date: 06/25/2019   Admission Diagnoses: Major depression  Intermittent Explosive disorder   Discharge Diagnoses:  Active Problems:   COPD (chronic obstructive pulmonary disease) (H[facility])   Depression with anxiety   Fatty liver   Hyperlipidemia   Obstructive sleep apnea   Obesity, morbid (H[facility])   Schizoaffective disorder (H[facility])   Polycythemia, secondary   Leukocytosis   Other specified abnormal findings of blood chemistry    Consults:  Psych TTS   ED   Significant Findings/ Diagnostic Studies: Procedures: none   Discharge Condition: stable  Disposition: back to boarding house   Diet: regular Discharge Activity: as tolerated    Follow-up Information    [facility] Schedule an appointment as soon as possible for a visit in 1 week.   Contact information: [address] [place] [address] [phone]              Brief Mental Status Alert cooperative oriented times four No frank psychosis or mania Consciousness not clouded or fluctuant Mood improved  Affect normal No active SI HI or plans contracts for safety Concentration and attention normal  No other movement problems      Total time spent taking care of this patient: 25-39 min minutes  Signed: Roselind Messier 06/25/2019, 11:00 AM

## 2019-06-25 NOTE — Discharge Instructions (Signed)
Continue all medications as directed by your doctor.  Return to the ER for worsening symptoms, feelings of hurting yourself or others, or other concerns. °
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
## 2019-06-27 ENCOUNTER — Inpatient Hospital Stay: Payer: Medicare Other

## 2019-07-04 ENCOUNTER — Inpatient Hospital Stay: Payer: Medicare Other

## 2019-07-04 ENCOUNTER — Other Ambulatory Visit: Payer: Self-pay

## 2019-07-04 VITALS — BP 126/82 | HR 83 | Temp 99.4°F | Resp 18

## 2019-07-04 DIAGNOSIS — D751 Secondary polycythemia: Secondary | ICD-10-CM | POA: Diagnosis not present

## 2019-07-04 NOTE — Progress Notes (Signed)
Patient questioned how long after phlebotomy could he donate plasma. Informed Dr. Orlie Dakin and team. Per Dr. Orlie Dakin, as long as patient is well hydrated then he could wait 1-2 weeks and then donate. Informed patient. Patient confirms understanding and denies any further questions or concerns.    Patient presented today for a rescheduled phlebotomy. Blood flow was good with IV initially but then developed issues with flowing slow and then stopping. Multiple interventions completed to get 370 cc blood total out of 500 cc. Patient did not wish to proceed further due to the time it was taking to get blood flow. Patient states he has been donating plasma every week x 1 month. Informed Dr. Orlie Dakin. Dr. Orlie Dakin believes this could be related to him donating plasma. Informed patient and patient stated he would take a break from donating until he sees him again in August.

## 2019-08-22 NOTE — Progress Notes (Signed)
[facility]  Telephone:(336) [phone] [phone]) [phone]  ID: Jeffrey Velasquez OB: 1985-03-17  MR#: [id number]  ACZ#:[id number]  Patient Care Team: Sherron Monday, MD as PCP - General (Internal Medicine) Jeralyn Ruths, MD as Consulting Physician (Oncology)  CHIEF COMPLAINT: Polycythemia.  INTERVAL HISTORY: Patient returns to clinic today for further evaluation and continuation of phlebotomy.  He currently feels well and is asymptomatic.  He noticed a nontender subcentimeter lump on his left testicle recently.  He has no neurologic complaints.  He denies any recent fevers or illnesses.  He has a good appetite and denies weight loss.  He has no chest pain, cough, or hemoptysis.  He does not complain of shortness of breath today.  He denies any nausea, vomiting, constipation, or diarrhea.  He has no urinary complaints.  Patient offers no further specific complaints today.  REVIEW OF SYSTEMS:   Review of Systems  Constitutional: Negative.  Negative for fever, malaise/fatigue and weight loss.  Respiratory: Negative.  Negative for cough, hemoptysis and shortness of breath.   Cardiovascular: Negative.  Negative for chest pain and leg swelling.  Gastrointestinal: Negative.  Negative for abdominal pain, blood in stool and melena.  Genitourinary: Negative.  Negative for dysuria.  Musculoskeletal: Negative.   Skin: Negative.  Negative for rash.  Neurological: Negative.  Negative for dizziness, focal weakness, weakness and headaches.  Psychiatric/Behavioral: Negative.  The patient is not nervous/anxious.     As per HPI. Otherwise, a complete review of systems is negative.  PAST MEDICAL HISTORY: Past Medical History:  Diagnosis Date  . Anxiety   . Depression   . Hyperlipidemia   . Schizophrenia (HCC)     PAST SURGICAL HISTORY: History reviewed. No pertinent surgical history.  FAMILY HISTORY: History reviewed. No pertinent family history.  ADVANCED  DIRECTIVES (Y/N):  N  HEALTH MAINTENANCE: Social History   Tobacco Use  . Smoking status: Current Every Day Smoker    Packs/day: 1.00    Types: Cigarettes  . Smokeless tobacco: Never Used  Vaping Use  . Vaping Use: Never used  Substance Use Topics  . Alcohol use: Yes    Comment: 3 four lokos 06/24/19  . Drug use: Not Currently     Colonoscopy:  PAP:  Bone density:  Lipid panel:  Allergies  Allergen Reactions  . Clonazepam   . Shellfish Allergy     Current Outpatient Medications  Medication Sig Dispense Refill  . albuterol (VENTOLIN HFA) 108 (90 Base) MCG/ACT inhaler Inhale 2 puffs into the lungs every 4 (four) hours as needed.    . ARIPiprazole ER (ABILIFY MAINTENA) 300 MG PRSY prefilled syringe Inject 300 mg into the muscle every 30 (thirty) days.    . budesonide-formoterol (SYMBICORT) 160-4.5 MCG/ACT inhaler Inhale 2 puffs into the lungs 2 (two) times daily.    . cetirizine (ZYRTEC) 10 MG tablet Take 10 mg by mouth daily.    . Cholecalciferol (VITAMIN D3) 1.25 MG (50000 UT) CAPS Take 1 capsule by mouth once a week.    . hydrOXYzine (VISTARIL) 50 MG capsule Take 50 mg by mouth 3 (three) times daily.    Marland Kitchen icosapent Ethyl (VASCEPA) 1 g capsule Take 2 g by mouth 2 (two) times daily.    . rosuvastatin (CRESTOR) 20 MG tablet Take 20 mg by mouth daily.     No current facility-administered medications for this visit.    OBJECTIVE: Vitals:   08/28/19 1432  BP: 117/79  Pulse: 84  Temp: 99 F (37.2 C)  Body mass index is 40.51 kg/m.    ECOG FS:0 - Asymptomatic  General: Well-developed, well-nourished, no acute distress. Eyes: Pink conjunctiva, anicteric sclera. HEENT: Normocephalic, moist mucous membranes. Lungs: No audible wheezing or coughing. Heart: Regular rate and rhythm. Abdomen: Soft, nontender, no obvious distention. Musculoskeletal: No edema, cyanosis, or clubbing. GU: Nontender, subcentimeter, movable nodule noted on left testicle. Neuro: Alert,  answering all questions appropriately. Cranial nerves grossly intact. Skin: No rashes or petechiae noted. Psych: Normal affect.   LAB RESULTS:  Lab Results  Component Value Date   NA 139 06/24/2019   K 4.2 06/24/2019   CL 105 06/24/2019   CO2 23 06/24/2019   GLUCOSE 94 06/24/2019   BUN 11 06/24/2019   CREATININE 0.98 06/24/2019   CALCIUM 9.8 06/24/2019   PROT 7.7 06/24/2019   ALBUMIN 4.7 06/24/2019   AST 139 (H) 06/24/2019   ALT 115 (H) 06/24/2019   ALKPHOS 54 06/24/2019   BILITOT 0.7 06/24/2019   GFRNONAA >60 06/24/2019   GFRAA >60 06/24/2019    Lab Results  Component Value Date   WBC 10.5 08/28/2019   NEUTROABS 6.3 08/28/2019   HGB 17.5 (H) 08/28/2019   HCT 50.5 08/28/2019   MCV 86.6 08/28/2019   PLT 160 08/28/2019     STUDIES: No results found.  ASSESSMENT: Polycythemia.  PLAN:    1.  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  Previously, patient's carbon monoxide level is greater than 10%.  All of his other laboratory work including iron panel, erythropoietin level, JAK2 mutation and hemochromatosis mutation were all either negative or within normal limits.  Goal hemoglobin will be less than 17.0, today's a 17.5.  Proceed with 500 mL phlebotomy today.  Return to clinic in 2 months with laboratory work and phlebotomy only and then in 4 months for laboratory work, further evaluation, and continuation of treatment if needed. 2.  Leukocytosis: Resolved.  Previously, peripheral blood flow cytometry and BCR-ABL mutation are negative.   3.  Testicular nodule: Nontender, movable, subcentimeter nodule noted on left testicle.  Have ordered testicular ultrasound for further evaluation.   Patient expressed understanding and was in agreement with this plan. He also understands that He can call clinic at any time with any questions, concerns, or complaints.    Jeralyn Ruths, MD   08/28/2019 3:02 PM

## 2019-08-27 ENCOUNTER — Other Ambulatory Visit: Payer: Self-pay

## 2019-08-27 DIAGNOSIS — K760 Fatty (change of) liver, not elsewhere classified: Secondary | ICD-10-CM

## 2019-08-27 DIAGNOSIS — D751 Secondary polycythemia: Secondary | ICD-10-CM

## 2019-08-27 DIAGNOSIS — D72829 Elevated white blood cell count, unspecified: Secondary | ICD-10-CM

## 2019-08-28 ENCOUNTER — Encounter: Payer: Self-pay | Admitting: Oncology

## 2019-08-28 ENCOUNTER — Other Ambulatory Visit: Payer: Self-pay

## 2019-08-28 ENCOUNTER — Other Ambulatory Visit: Payer: Self-pay | Admitting: *Deleted

## 2019-08-28 ENCOUNTER — Inpatient Hospital Stay (HOSPITAL_BASED_OUTPATIENT_CLINIC_OR_DEPARTMENT_OTHER): Payer: Medicare Other | Admitting: Oncology

## 2019-08-28 ENCOUNTER — Inpatient Hospital Stay: Payer: Medicare Other

## 2019-08-28 ENCOUNTER — Inpatient Hospital Stay: Payer: Medicare Other | Attending: Oncology

## 2019-08-28 VITALS — BP 117/79 | HR 84 | Temp 99.0°F | Wt 298.7 lb

## 2019-08-28 VITALS — BP 118/78 | HR 82

## 2019-08-28 DIAGNOSIS — F17210 Nicotine dependence, cigarettes, uncomplicated: Secondary | ICD-10-CM | POA: Insufficient documentation

## 2019-08-28 DIAGNOSIS — K760 Fatty (change of) liver, not elsewhere classified: Secondary | ICD-10-CM

## 2019-08-28 DIAGNOSIS — Z79899 Other long term (current) drug therapy: Secondary | ICD-10-CM | POA: Diagnosis not present

## 2019-08-28 DIAGNOSIS — D751 Secondary polycythemia: Secondary | ICD-10-CM

## 2019-08-28 DIAGNOSIS — Z862 Personal history of diseases of the blood and blood-forming organs and certain disorders involving the immune mechanism: Secondary | ICD-10-CM | POA: Insufficient documentation

## 2019-08-28 DIAGNOSIS — N5089 Other specified disorders of the male genital organs: Secondary | ICD-10-CM | POA: Diagnosis not present

## 2019-08-28 DIAGNOSIS — E785 Hyperlipidemia, unspecified: Secondary | ICD-10-CM | POA: Diagnosis not present

## 2019-08-28 DIAGNOSIS — F209 Schizophrenia, unspecified: Secondary | ICD-10-CM | POA: Diagnosis not present

## 2019-08-28 DIAGNOSIS — N509 Disorder of male genital organs, unspecified: Secondary | ICD-10-CM | POA: Insufficient documentation

## 2019-08-28 DIAGNOSIS — F418 Other specified anxiety disorders: Secondary | ICD-10-CM | POA: Diagnosis not present

## 2019-08-28 DIAGNOSIS — D72829 Elevated white blood cell count, unspecified: Secondary | ICD-10-CM

## 2019-08-28 LAB — CBC WITH DIFFERENTIAL/PLATELET
Abs Immature Granulocytes: 0.05 10*3/uL (ref 0.00–0.07)
Basophils Absolute: 0.1 10*3/uL (ref 0.0–0.1)
Basophils Relative: 1 %
Eosinophils Absolute: 0.6 10*3/uL — ABNORMAL HIGH (ref 0.0–0.5)
Eosinophils Relative: 5 %
HCT: 50.5 % (ref 39.0–52.0)
Hemoglobin: 17.5 g/dL — ABNORMAL HIGH (ref 13.0–17.0)
Immature Granulocytes: 1 %
Lymphocytes Relative: 28 %
Lymphs Abs: 3 10*3/uL (ref 0.7–4.0)
MCH: 30 pg (ref 26.0–34.0)
MCHC: 34.7 g/dL (ref 30.0–36.0)
MCV: 86.6 fL (ref 80.0–100.0)
Monocytes Absolute: 0.5 10*3/uL (ref 0.1–1.0)
Monocytes Relative: 5 %
Neutro Abs: 6.3 10*3/uL (ref 1.7–7.7)
Neutrophils Relative %: 60 %
Platelets: 160 10*3/uL (ref 150–400)
RBC: 5.83 MIL/uL — ABNORMAL HIGH (ref 4.22–5.81)
RDW: 12.4 % (ref 11.5–15.5)
WBC: 10.5 10*3/uL (ref 4.0–10.5)
nRBC: 0 % (ref 0.0–0.2)

## 2019-08-28 LAB — IRON AND TIBC
Iron: 101 ug/dL (ref 45–182)
Saturation Ratios: 26 % (ref 17.9–39.5)
TIBC: 395 ug/dL (ref 250–450)
UIBC: 294 ug/dL

## 2019-08-28 LAB — FERRITIN: Ferritin: 158 ng/mL (ref 24–336)

## 2019-08-28 NOTE — Progress Notes (Signed)
Patient here for follow up. He reports that he has found a lump in his left testicle about 4 weeks ago. It is not painful and seems to be in the scrotal sac not attached to the testicle.

## 2019-08-28 NOTE — Addendum Note (Signed)
Addended by: Asher Muir A on: 08/28/2019 03:22 PM   Modules accepted: Orders

## 2019-08-28 NOTE — Progress Notes (Unsigned)
Ul

## 2019-09-11 ENCOUNTER — Ambulatory Visit: Payer: Medicare Other

## 2019-10-28 ENCOUNTER — Inpatient Hospital Stay: Payer: Medicare Other

## 2019-10-28 ENCOUNTER — Inpatient Hospital Stay: Payer: Medicare Other | Attending: Oncology

## 2019-12-26 ENCOUNTER — Other Ambulatory Visit: Payer: Self-pay | Admitting: *Deleted

## 2019-12-26 DIAGNOSIS — D72829 Elevated white blood cell count, unspecified: Secondary | ICD-10-CM

## 2019-12-29 ENCOUNTER — Inpatient Hospital Stay: Payer: Medicare Other

## 2019-12-29 ENCOUNTER — Inpatient Hospital Stay: Payer: Medicare Other | Admitting: Oncology

## 2020-04-15 ENCOUNTER — Other Ambulatory Visit: Payer: Self-pay

## 2020-04-15 ENCOUNTER — Emergency Department
Admission: EM | Admit: 2020-04-15 | Discharge: 2020-04-15 | Disposition: A | Discharge status: Home / Self Care | Payer: Medicare Other | Attending: Emergency Medicine | Admitting: Emergency Medicine

## 2020-04-15 DIAGNOSIS — J449 Chronic obstructive pulmonary disease, unspecified: Secondary | ICD-10-CM | POA: Diagnosis not present

## 2020-04-15 DIAGNOSIS — R030 Elevated blood-pressure reading, without diagnosis of hypertension: Secondary | ICD-10-CM | POA: Diagnosis present

## 2020-04-15 DIAGNOSIS — F17210 Nicotine dependence, cigarettes, uncomplicated: Secondary | ICD-10-CM | POA: Insufficient documentation

## 2020-04-15 DIAGNOSIS — I10 Essential (primary) hypertension: Secondary | ICD-10-CM | POA: Diagnosis not present

## 2020-04-15 DIAGNOSIS — Z7951 Long term (current) use of inhaled steroids: Secondary | ICD-10-CM | POA: Insufficient documentation

## 2020-04-15 LAB — CBC WITH DIFFERENTIAL/PLATELET
Abs Immature Granulocytes: 0.02 10*3/uL (ref 0.00–0.07)
Basophils Absolute: 0.1 10*3/uL (ref 0.0–0.1)
Basophils Relative: 1 %
Eosinophils Absolute: 0.6 10*3/uL — ABNORMAL HIGH (ref 0.0–0.5)
Eosinophils Relative: 6 %
HCT: 52.7 % — ABNORMAL HIGH (ref 39.0–52.0)
Hemoglobin: 17.9 g/dL — ABNORMAL HIGH (ref 13.0–17.0)
Immature Granulocytes: 0 %
Lymphocytes Relative: 28 %
Lymphs Abs: 2.9 10*3/uL (ref 0.7–4.0)
MCH: 30 pg (ref 26.0–34.0)
MCHC: 34 g/dL (ref 30.0–36.0)
MCV: 88.3 fL (ref 80.0–100.0)
Monocytes Absolute: 0.6 10*3/uL (ref 0.1–1.0)
Monocytes Relative: 6 %
Neutro Abs: 6.1 10*3/uL (ref 1.7–7.7)
Neutrophils Relative %: 59 %
Platelets: 185 10*3/uL (ref 150–400)
RBC: 5.97 MIL/uL — ABNORMAL HIGH (ref 4.22–5.81)
RDW: 11.9 % (ref 11.5–15.5)
WBC: 10.4 10*3/uL (ref 4.0–10.5)
nRBC: 0 % (ref 0.0–0.2)

## 2020-04-15 LAB — BASIC METABOLIC PANEL
Anion gap: 8 (ref 5–15)
BUN: 13 mg/dL (ref 6–20)
CO2: 26 mmol/L (ref 22–32)
Calcium: 9.6 mg/dL (ref 8.9–10.3)
Chloride: 105 mmol/L (ref 98–111)
Creatinine, Ser: 0.98 mg/dL (ref 0.61–1.24)
GFR, Estimated: >60 mL/min (ref >60)
Glucose, Bld: 113 mg/dL — ABNORMAL HIGH (ref 70–99)
Potassium: 4.1 mmol/L (ref 3.5–5.1)
Sodium: 139 mmol/L (ref 135–145)

## 2020-04-15 LAB — TROPONIN I (HIGH SENSITIVITY): Troponin I (High Sensitivity): 3 ng/L (ref <18)

## 2020-04-15 MED ORDER — LISINOPRIL 5 MG PO TABS: 5.0000 mg | ORAL_TABLET | Freq: Every day | ORAL | 11 refills | Status: DC

## 2020-04-15 NOTE — ED Triage Notes (Signed)
Had routine BP check at his home by his nurse and was found to be hypertensive 164/111BP HR80. Pt denies chest pain or SOB, states he feels fine.

## 2020-04-15 NOTE — ED Notes (Signed)
Provided DC instructions. Verbalized understanding.  

## 2020-04-15 NOTE — ED Provider Notes (Signed)
[facility] Emergency Department Provider Note   ____________________________________________   Event Date/Time   First MD Initiated Contact with Patient 04/15/20 1202     (approximate)  I have reviewed the triage vital signs and the nursing notes.   HISTORY  Chief Complaint Hypertension    HPI Jeffrey Velasquez is a 35 y.o. male with a stated past medical history of schizoaffective disorder, anxiety/depression, and a lipidemia who presents for asymptomatic hypertension.  Patient states that he checked his blood pressure at home and found it to be 164/111.  Patient denies taking any medication for his blood pressure and it has normalized upon my evaluation.  Patient currently denies any vision changes, tinnitus, difficulty speaking, facial droop, sore throat, chest pain, shortness of breath, abdominal pain, nausea/vomiting/diarrhea, dysuria, or weakness/numbness/paresthesias in any extremity         Past Medical History:  Diagnosis Date  . Anxiety   . Depression   . Hyperlipidemia   . Schizophrenia [facility])     Patient Active Problem List   Diagnosis Date Noted  . Polycythemia, secondary 04/11/2019  . Leukocytosis 04/11/2019  . COPD (chronic obstructive pulmonary disease) (HCC) 04/10/2019  . Depression with anxiety 04/10/2019  . Fatty liver 04/10/2019  . Hyperlipidemia 04/10/2019  . Obstructive sleep apnea 04/10/2019  . Obesity, morbid (HCC) 04/10/2019  . Schizoaffective disorder (HCC) 04/10/2019  . Other specified abnormal findings of blood chemistry 03/02/2014    History reviewed. No pertinent surgical history.  Prior to Admission medications   Medication Sig Start Date End Date Taking? Authorizing Provider  lisinopril (ZESTRIL) 5 MG tablet Take 1 tablet (5 mg total) by mouth daily. 04/15/20 04/15/21 Yes Elsie Sakuma, Clent Jacks, MD  albuterol (VENTOLIN HFA) 108 (90 Base) MCG/ACT inhaler Inhale 2 puffs into the lungs every 4 (four) hours as needed.     [provider]  ARIPiprazole ER (ABILIFY MAINTENA) 300 MG PRSY prefilled syringe Inject 300 mg into the muscle every 30 (thirty) days.    [provider]  budesonide-formoterol (SYMBICORT) 160-4.5 MCG/ACT inhaler Inhale 2 puffs into the lungs 2 (two) times daily.    [provider]  cetirizine (ZYRTEC) 10 MG tablet Take 10 mg by mouth daily.    [provider]  Cholecalciferol (VITAMIN D3) 1.25 MG (50000 UT) CAPS Take 1 capsule by mouth once a week.    [provider]  hydrOXYzine (VISTARIL) 50 MG capsule Take 50 mg by mouth 3 (three) times daily.    [provider]  icosapent Ethyl (VASCEPA) 1 g capsule Take 2 g by mouth 2 (two) times daily.    [provider]  rosuvastatin (CRESTOR) 20 MG tablet Take 20 mg by mouth daily.    [provider]    Allergies Clonazepam and Shellfish allergy  No family history on file.  Social History Social History   Tobacco Use  . Smoking status: Current Every Day Smoker    Packs/day: 1.00    Types: Cigarettes  . Smokeless tobacco: Never Used  Vaping Use  . Vaping Use: Never used  Substance Use Topics  . Alcohol use: Yes    Comment: 3 four lokos 06/24/19  . Drug use: Not Currently    Review of Systems Constitutional: No fever/chills Eyes: No visual changes. ENT: No sore throat. Cardiovascular: Denies chest pain. Respiratory: Denies shortness of breath. Gastrointestinal: No abdominal pain.  No nausea, no vomiting.  No diarrhea. Genitourinary: Negative for dysuria. Musculoskeletal: Negative for acute arthralgias Skin: Negative for  rash. Neurological: Negative for headaches, weakness/numbness/paresthesias in any extremity Psychiatric: Negative for suicidal ideation/homicidal ideation   ____________________________________________   PHYSICAL EXAM:  VITAL SIGNS: ED Triage Vitals  Enc Vitals Group     BP 04/15/20 1157 (!) 154/104     Pulse Rate 04/15/20 1157 95      Resp 04/15/20 1157 18     Temp 04/15/20 1157 98.5 F (36.9 C)     Temp Source 04/15/20 1157 Oral     SpO2 04/15/20 1157 100 %     Weight 04/15/20 1158 290 lb (131.5 kg)     Height 04/15/20 1158 6' (1.829 m)     Head Circumference --      Peak Flow --      Pain Score 04/15/20 1158 0     Pain Loc --      Pain Edu? --      Excl. in GC? --    Constitutional: Alert and oriented. Well appearing and in no acute distress. Eyes: Conjunctivae are normal. PERRL. Head: Atraumatic. Nose: No congestion/rhinnorhea. Mouth/Throat: Mucous membranes are moist. Neck: No stridor Cardiovascular: Grossly normal heart sounds.  Good peripheral circulation. Respiratory: Normal respiratory effort.  No retractions. Gastrointestinal: Soft and nontender. No distention. Musculoskeletal: No obvious deformities Neurologic:  Normal speech and language. No gross focal neurologic deficits are appreciated. Skin:  Skin is warm and dry. No rash noted. Psychiatric: Mood and affect are normal. Speech and behavior are normal.  ____________________________________________   LABS (all labs ordered are listed, but only abnormal results are displayed)  Labs Reviewed  BASIC METABOLIC PANEL - Abnormal; Notable for the following components:      Result Value   Glucose, Bld 113 (*)    All other components within normal limits  CBC WITH DIFFERENTIAL/PLATELET - Abnormal; Notable for the following components:   RBC 5.97 (*)    Hemoglobin 17.9 (*)    HCT 52.7 (*)    Eosinophils Absolute 0.6 (*)    All other components within normal limits  TROPONIN I (HIGH SENSITIVITY)  TROPONIN I (HIGH SENSITIVITY)   ____________________________________________  EKG  ED ECG REPORT I, Merwyn Katos, the attending physician, personally viewed and interpreted this ECG.  Date: 04/15/2020 EKG Time: 1228 Rate: 76 Rhythm: normal sinus rhythm QRS Axis: normal Intervals: normal ST/T Wave abnormalities: normal Narrative  Interpretation: no evidence of acute ischemia  PROCEDURES  Procedure(s) performed (including Critical Care):  .1-3 Lead EKG Interpretation Performed by: Merwyn Katos, MD Authorized by: Merwyn Katos, MD     Interpretation: normal     ECG rate:  94   ECG rate assessment: normal     Rhythm: sinus rhythm     Ectopy: none     Conduction: normal       ____________________________________________   INITIAL IMPRESSION / ASSESSMENT AND PLAN / ED COURSE  As part of my medical decision making, I reviewed the following data within the electronic MEDICAL RECORD NUMBER Nursing notes reviewed and incorporated, Labs reviewed, EKG interpreted, Old chart reviewed, and Notes from prior ED visits reviewed and incorporated        Presents to the emergency department complaining of high blood pressure. Patient is otherwise asymptomatic without confusion, chest pain, hematuria, or SOB.  DDx: CV, AMI, heart failure, renal infarction or failure or other end organ damage.  Disposition: Discussed with patient their elevated blood pressure and need for close outpatient management of their hypertension. Will provide a prescription for lisinopril who 5mg  PO daily  and arrange for the patient to follow up in a primary care clinic      ____________________________________________   FINAL CLINICAL IMPRESSION(S) / ED DIAGNOSES  Final diagnoses:  Primary hypertension     ED Discharge Orders         Ordered    lisinopril (ZESTRIL) 5 MG tablet  Daily        04/15/20 1341           Note:  This document was prepared using Dragon voice recognition software and may include unintentional dictation errors.   Merwyn Katos, MD 04/15/20 [phone]

## 2020-09-09 NOTE — Congregational Nurse Program (Signed)
  Dept: [phone]   Congregational Nurse Program Note  Date of Encounter: 09/09/2020 Client new to clinic, in to for foot care. He reports sore feet from walking and his shoes getting wet. Foot care provided aong with clean socks. Also assisted his in making a appointment with his PCP. Apt set for 9/2 at 11:30. Client plans to take a the bus. VS 128/68, Hr 86. Encouraged client to continue clinic visits for follow up of Md visit.   Past Medical History: Past Medical History:  Diagnosis Date   Anxiety    Depression    Hyperlipidemia    Schizophrenia (HCC)     Encounter Details:  CNP Questionnaire - 09/09/20 1053       Questionnaire   Do you give verbal consent to treat you today? Yes    Visit Setting Church or Organization    Location Patient Served At [facility]    Patient Status Homeless    Medical Provider Yes    Insurance Unknown    Intervention Assess (including screenings);Support    Housing/Utilities No permanent housing

## 2021-04-21 ENCOUNTER — Emergency Department
Admission: EM | Admit: 2021-04-21 | Discharge: 2021-04-22 | Payer: Medicare Other | Attending: Emergency Medicine | Admitting: Emergency Medicine

## 2021-04-21 DIAGNOSIS — Y909 Presence of alcohol in blood, level not specified: Secondary | ICD-10-CM | POA: Insufficient documentation

## 2021-04-21 DIAGNOSIS — R45850 Homicidal ideations: Secondary | ICD-10-CM | POA: Diagnosis not present

## 2021-04-21 DIAGNOSIS — F1010 Alcohol abuse, uncomplicated: Secondary | ICD-10-CM | POA: Diagnosis not present

## 2021-04-21 DIAGNOSIS — Z20822 Contact with and (suspected) exposure to covid-19: Secondary | ICD-10-CM | POA: Insufficient documentation

## 2021-04-21 DIAGNOSIS — F250 Schizoaffective disorder, bipolar type: Secondary | ICD-10-CM | POA: Diagnosis present

## 2021-04-21 DIAGNOSIS — F259 Schizoaffective disorder, unspecified: Secondary | ICD-10-CM | POA: Insufficient documentation

## 2021-04-21 DIAGNOSIS — J45909 Unspecified asthma, uncomplicated: Secondary | ICD-10-CM | POA: Diagnosis not present

## 2021-04-21 DIAGNOSIS — F1910 Other psychoactive substance abuse, uncomplicated: Secondary | ICD-10-CM

## 2021-04-21 LAB — COMPREHENSIVE METABOLIC PANEL
ALT: 42 U/L (ref 0–44)
AST: 37 U/L (ref 15–41)
Albumin: 4.4 g/dL (ref 3.5–5.0)
Alkaline Phosphatase: 50 U/L (ref 38–126)
Anion gap: 9 (ref 5–15)
BUN: 11 mg/dL (ref 6–20)
CO2: 25 mmol/L (ref 22–32)
Calcium: 9.6 mg/dL (ref 8.9–10.3)
Chloride: 106 mmol/L (ref 98–111)
Creatinine, Ser: 0.94 mg/dL (ref 0.61–1.24)
GFR, Estimated: >60 mL/min (ref >60)
Glucose, Bld: 106 mg/dL — ABNORMAL HIGH (ref 70–99)
Potassium: 3.6 mmol/L (ref 3.5–5.1)
Sodium: 140 mmol/L (ref 135–145)
Total Bilirubin: 0.8 mg/dL (ref 0.3–1.2)
Total Protein: 7.5 g/dL (ref 6.5–8.1)

## 2021-04-21 LAB — CBC
HCT: 52.1 % — ABNORMAL HIGH (ref 39.0–52.0)
Hemoglobin: 18 g/dL — ABNORMAL HIGH (ref 13.0–17.0)
MCH: 31.5 pg (ref 26.0–34.0)
MCHC: 34.5 g/dL (ref 30.0–36.0)
MCV: 91.1 fL (ref 80.0–100.0)
Platelets: 187 10*3/uL (ref 150–400)
RBC: 5.72 MIL/uL (ref 4.22–5.81)
RDW: 12.3 % (ref 11.5–15.5)
WBC: 11.4 10*3/uL — ABNORMAL HIGH (ref 4.0–10.5)
nRBC: 0 % (ref 0.0–0.2)

## 2021-04-21 LAB — URINE DRUG SCREEN, QUALITATIVE (ARMC ONLY)
Amphetamines, Ur Screen: NOT DETECTED
Barbiturates, Ur Screen: NOT DETECTED
Benzodiazepine, Ur Scrn: NOT DETECTED
Cannabinoid 50 Ng, Ur ~~LOC~~: NOT DETECTED
Cocaine Metabolite,Ur ~~LOC~~: NOT DETECTED
MDMA (Ecstasy)Ur Screen: NOT DETECTED
Methadone Scn, Ur: NOT DETECTED
Opiate, Ur Screen: NOT DETECTED
Phencyclidine (PCP) Ur S: NOT DETECTED
Tricyclic, Ur Screen: NOT DETECTED

## 2021-04-21 LAB — RESP PANEL BY RT-PCR (FLU A&B, COVID) ARPGX2
Influenza A by PCR: NEGATIVE
Influenza B by PCR: NEGATIVE
SARS Coronavirus 2 by RT PCR: NEGATIVE

## 2021-04-21 LAB — ETHANOL: Alcohol, Ethyl (B): <10 mg/dL (ref <10)

## 2021-04-21 MED ORDER — LORAZEPAM 2 MG/ML IJ SOLN: 1.0000 mg | INTRAMUSCULAR | Status: DC | PRN

## 2021-04-21 MED ORDER — HYDROXYZINE HCL 25 MG PO TABS
50.0000 mg | ORAL_TABLET | Freq: Three times a day (TID) | ORAL | Status: DC
  Administered 2021-04-21 (×2): 50 mg via ORAL
  Filled 2021-04-21 (×2): qty 2

## 2021-04-21 MED ORDER — PROPRANOLOL HCL 10 MG PO TABS
10.0000 mg | ORAL_TABLET | Freq: Two times a day (BID) | ORAL | Status: DC
  Administered 2021-04-21: 10 mg via ORAL
  Filled 2021-04-21: qty 1

## 2021-04-21 MED ORDER — LORATADINE 10 MG PO TABS
10.0000 mg | ORAL_TABLET | Freq: Every day | ORAL | Status: DC
  Administered 2021-04-21: 10 mg via ORAL
  Filled 2021-04-21: qty 1

## 2021-04-21 MED ORDER — LISINOPRIL 5 MG PO TABS
5.0000 mg | ORAL_TABLET | Freq: Every day | ORAL | Status: DC
  Administered 2021-04-21: 5 mg via ORAL
  Filled 2021-04-21: qty 1

## 2021-04-21 MED ORDER — NICOTINE 14 MG/24HR TD PT24
14.0000 mg | MEDICATED_PATCH | Freq: Every day | TRANSDERMAL | Status: DC
  Administered 2021-04-21: 14 mg via TRANSDERMAL
  Filled 2021-04-21: qty 1

## 2021-04-21 MED ORDER — THIAMINE HCL 100 MG PO TABS
100.0000 mg | ORAL_TABLET | Freq: Every day | ORAL | Status: DC
  Administered 2021-04-21: 100 mg via ORAL
  Filled 2021-04-21: qty 1

## 2021-04-21 MED ORDER — ADULT MULTIVITAMIN W/MINERALS CH
1.0000 | ORAL_TABLET | Freq: Every day | ORAL | Status: DC
  Administered 2021-04-21: 1 via ORAL
  Filled 2021-04-21: qty 1

## 2021-04-21 MED ORDER — ALBUTEROL SULFATE HFA 108 (90 BASE) MCG/ACT IN AERS
2.0000 | INHALATION_SPRAY | RESPIRATORY_TRACT | Status: DC | PRN
  Filled 2021-04-21: qty 6.7

## 2021-04-21 MED ORDER — THIAMINE HCL 100 MG/ML IJ SOLN
100.0000 mg | Freq: Every day | INTRAMUSCULAR | Status: DC
  Filled 2021-04-21 (×2): qty 1

## 2021-04-21 MED ORDER — LORAZEPAM 1 MG PO TABS: 1.0000 mg | ORAL_TABLET | ORAL | Status: DC | PRN

## 2021-04-21 MED ORDER — FLUOXETINE HCL 20 MG PO CAPS: 60.0000 mg | ORAL_CAPSULE | Freq: Every morning | ORAL | Status: DC

## 2021-04-21 MED ORDER — FOLIC ACID 1 MG PO TABS
1.0000 mg | ORAL_TABLET | Freq: Every day | ORAL | Status: DC
  Administered 2021-04-21: 1 mg via ORAL
  Filled 2021-04-21: qty 1

## 2021-04-21 MED ORDER — ROSUVASTATIN CALCIUM 20 MG PO TABS
20.0000 mg | ORAL_TABLET | Freq: Every day | ORAL | Status: DC
  Administered 2021-04-21: 20 mg via ORAL
  Filled 2021-04-21 (×2): qty 1

## 2021-04-21 MED ORDER — MOMETASONE FURO-FORMOTEROL FUM 200-5 MCG/ACT IN AERO
2.0000 | INHALATION_SPRAY | Freq: Two times a day (BID) | RESPIRATORY_TRACT | Status: DC
  Filled 2021-04-21: qty 8.8

## 2021-04-21 MED ORDER — ICOSAPENT ETHYL 1 G PO CAPS
2.0000 g | ORAL_CAPSULE | Freq: Two times a day (BID) | ORAL | Status: DC
  Administered 2021-04-21: 2 g via ORAL
  Filled 2021-04-21 (×2): qty 2
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
## 2021-04-21 NOTE — BH Assessment (Signed)
Referral information for Psychiatric Hospitalization faxed to;   [facility] ([phone]-or- [phone]),   [facility] ([phone]),  [facility] ([phone]),   [facility] ([phone] -or- [phone]),   [facility] ([phone])   
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
## 2021-04-21 NOTE — BH Assessment (Signed)
PATIENT BED AVAILABLE AFTER 8AM FOR 04/22/21 ? ?Patient has been accepted to [facility].  ?Accepting physician is Dr. Jonelle Sports.  ?Call report to [phone].  ?Representative was [place].  ? ?ER Staff is aware of it:  ?Scientific laboratory technician  ?Dr. Charlann Boxer, ER MD  ?Gerald Stabs Patient's Nurse ?    ? ?

## 2021-04-21 NOTE — TOC Initial Note (Signed)
Transition of Care (TOC) - Initial/Assessment Note  ? ? ?Patient Details  ?Name: Jeffrey Velasquez ?MRN: [id number] ?Date of Birth: 28-Apr-1985 ? ?Transition of Care (TOC) CM/SW Contact:    ?Shelbie Hutching, RN ?Phone Number: ?04/21/2021, 4:18 PM ? ?Clinical Narrative:                 ?TOC consult acknowledges consult.  Psychiatry and TTS consulted.  ?TOC will complete consult.   ? ?Please re-consult if needs arrive.   ? ?  ?  ? ? ?Patient Goals and CMS Choice ?  ?  ?  ? ?Expected Discharge Plan and Services ?  ?  ?  ?  ?  ?                ?  ?  ?  ?  ?  ?  ?  ?  ?  ?  ? ?Prior Living Arrangements/Services ?  ?  ?  ?       ?  ?  ?  ?  ? ?Activities of Daily Living ?  ?  ? ?Permission Sought/Granted ?  ?  ?   ?   ?   ?   ? ?Emotional Assessment ?  ?  ?  ?  ?  ?  ? ?Admission diagnosis:  IVC ?Patient Active Problem List  ? Diagnosis Date Noted  ? Polycythemia, secondary 04/11/2019  ? Leukocytosis 04/11/2019  ? COPD (chronic obstructive pulmonary disease) ([place]) 04/10/2019  ? Depression with anxiety 04/10/2019  ? Fatty liver 04/10/2019  ? Hyperlipidemia 04/10/2019  ? Obstructive sleep apnea 04/10/2019  ? Obesity, morbid ([place]) 04/10/2019  ? Schizoaffective disorder ([place]) 04/10/2019  ? Other specified abnormal findings of blood chemistry 03/02/2014  ? ?PCP:  Jodi Marble, MD ?Pharmacy:   ?[address] ?[place] [address] ?Phone: [phone] Fax: [phone] ? ? ? ? ?Social Determinants of Health (SDOH) Interventions ?  ? ?Readmission Risk Interventions ?   ? View : No data to display.  ?  ?  ?  ? ? ? ?
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
## 2021-04-21 NOTE — ED Notes (Signed)
IVC pending consult   
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
## 2021-04-21 NOTE — ED Notes (Signed)
Report received from Jane RN including SBAR. Patient alert and oriented, warm and dry, in no acute distress. Patient denies SI, HI, AVH and pain. Patient made aware of Q15 minute rounds and security cameras for their safety. Patient instructed to come to this nurse with needs or concerns.  ?

## 2021-04-21 NOTE — ED Notes (Signed)
Pt reports that he no longer feels anxious ?
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
## 2021-04-22 NOTE — ED Notes (Signed)
Unable to contact [facility] again. Switch board contacted, able to speak with admin office and leave call back number.  ?

## 2021-04-22 NOTE — ED Notes (Signed)
Attempted to call report to [facility] again using [phone]. Unable to contact again.  ?

## 2021-04-22 NOTE — ED Provider Notes (Signed)
Emergency Medicine Observation Re-evaluation Note ? ?KASTEN LEVEQUE is a 36 y.o. male, seen on rounds today.   ? ?Physical Exam  ?BP (!) 155/101 (BP Location: Left Arm)   Pulse (!) 102   Temp 98.8 ?F (37.1 ?C) (Oral)   Resp 20   Ht 6' (1.829 m)   Wt 127 kg   SpO2 95%   BMI 37.97 kg/m?  ?Physical Exam ?General: Patient resting comfortably in bed ?Lungs: Patient in no respiratory distress ?Psych: Patient is not combative ? ?ED Course / MDM  ?EKG:  ? ? ? ?Plan  ?Current plan is for transfer [facility] this morning for inpatient psychiatric care. ?LOUAY MYRIE is under involuntary commitment. ?  ? ?  ?Arnaldo Natal, MD ?04/22/21 [phone] ? ?

## 2021-04-22 NOTE — ED Notes (Signed)
Called c com for [facility] for transport to [facility] ?

## 2021-04-22 NOTE — ED Notes (Signed)
Attempted x2 to call report to [facility]. Unable to contact facility with number provided.  ?

## 2021-04-22 NOTE — ED Notes (Signed)
Pt given breakfast tray at this time. 

## 2021-04-25 ENCOUNTER — Encounter: Payer: Self-pay | Admitting: Oncology

## 2021-05-11 ENCOUNTER — Encounter: Payer: Self-pay | Admitting: Oncology

## 2021-07-21 ENCOUNTER — Encounter: Payer: Self-pay | Admitting: Oncology

## 2022-01-24 DIAGNOSIS — Z0000 Encounter for general adult medical examination without abnormal findings: Secondary | ICD-10-CM | POA: Diagnosis not present

## 2022-01-24 DIAGNOSIS — E785 Hyperlipidemia, unspecified: Secondary | ICD-10-CM | POA: Diagnosis not present

## 2022-01-24 DIAGNOSIS — I10 Essential (primary) hypertension: Secondary | ICD-10-CM | POA: Diagnosis not present

## 2022-01-24 DIAGNOSIS — R109 Unspecified abdominal pain: Secondary | ICD-10-CM | POA: Diagnosis not present

## 2022-01-24 DIAGNOSIS — R112 Nausea with vomiting, unspecified: Secondary | ICD-10-CM | POA: Diagnosis not present

## 2022-01-24 DIAGNOSIS — R7303 Prediabetes: Secondary | ICD-10-CM | POA: Diagnosis not present

## 2022-01-24 DIAGNOSIS — R21 Rash and other nonspecific skin eruption: Secondary | ICD-10-CM | POA: Diagnosis not present

## 2022-01-24 DIAGNOSIS — E559 Vitamin D deficiency, unspecified: Secondary | ICD-10-CM | POA: Diagnosis not present

## 2022-01-24 DIAGNOSIS — K2100 Gastro-esophageal reflux disease with esophagitis, without bleeding: Secondary | ICD-10-CM | POA: Diagnosis not present

## 2022-01-31 DIAGNOSIS — G4733 Obstructive sleep apnea (adult) (pediatric): Secondary | ICD-10-CM | POA: Diagnosis not present

## 2022-02-10 ENCOUNTER — Other Ambulatory Visit: Payer: Self-pay | Admitting: Family

## 2022-02-10 MED ORDER — ATORVASTATIN CALCIUM 40 MG PO TABS: 40.0000 mg | ORAL_TABLET | Freq: Every day | ORAL | 3 refills | Status: DC

## 2022-02-24 ENCOUNTER — Other Ambulatory Visit: Payer: Self-pay | Admitting: Family

## 2022-03-26 ENCOUNTER — Encounter: Payer: Self-pay | Admitting: Oncology

## 2022-04-12 DIAGNOSIS — B078 Other viral warts: Secondary | ICD-10-CM | POA: Diagnosis not present

## 2022-04-24 ENCOUNTER — Encounter: Payer: Self-pay | Admitting: Oncology

## 2022-04-25 ENCOUNTER — Ambulatory Visit: Payer: 59 | Admitting: Family

## 2022-05-30 DIAGNOSIS — J449 Chronic obstructive pulmonary disease, unspecified: Secondary | ICD-10-CM | POA: Diagnosis not present

## 2022-06-01 DIAGNOSIS — K219 Gastro-esophageal reflux disease without esophagitis: Secondary | ICD-10-CM | POA: Diagnosis not present

## 2022-06-01 DIAGNOSIS — Z720 Tobacco use: Secondary | ICD-10-CM | POA: Diagnosis not present

## 2022-06-01 DIAGNOSIS — R112 Nausea with vomiting, unspecified: Secondary | ICD-10-CM | POA: Diagnosis not present

## 2022-06-01 DIAGNOSIS — R1012 Left upper quadrant pain: Secondary | ICD-10-CM | POA: Diagnosis not present

## 2022-06-01 DIAGNOSIS — R1013 Epigastric pain: Secondary | ICD-10-CM | POA: Diagnosis not present

## 2022-07-10 DIAGNOSIS — J449 Chronic obstructive pulmonary disease, unspecified: Secondary | ICD-10-CM | POA: Diagnosis not present

## 2022-07-15 DIAGNOSIS — J209 Acute bronchitis, unspecified: Secondary | ICD-10-CM | POA: Diagnosis not present

## 2022-07-30 DIAGNOSIS — J209 Acute bronchitis, unspecified: Secondary | ICD-10-CM | POA: Diagnosis not present

## 2022-07-30 DIAGNOSIS — Z20822 Contact with and (suspected) exposure to covid-19: Secondary | ICD-10-CM | POA: Diagnosis not present

## 2022-07-30 DIAGNOSIS — R070 Pain in throat: Secondary | ICD-10-CM | POA: Diagnosis not present

## 2022-08-03 DIAGNOSIS — J449 Chronic obstructive pulmonary disease, unspecified: Secondary | ICD-10-CM | POA: Diagnosis not present

## 2022-08-07 ENCOUNTER — Encounter: Payer: Self-pay | Admitting: Oncology

## 2022-08-16 DIAGNOSIS — R053 Chronic cough: Secondary | ICD-10-CM | POA: Diagnosis not present

## 2022-08-16 DIAGNOSIS — R070 Pain in throat: Secondary | ICD-10-CM | POA: Diagnosis not present

## 2022-08-16 DIAGNOSIS — R918 Other nonspecific abnormal finding of lung field: Secondary | ICD-10-CM | POA: Diagnosis not present

## 2022-09-05 ENCOUNTER — Encounter: Payer: Self-pay | Admitting: Family

## 2022-09-05 ENCOUNTER — Ambulatory Visit (INDEPENDENT_AMBULATORY_CARE_PROVIDER_SITE_OTHER): Payer: 59 | Admitting: Family

## 2022-09-05 VITALS — BP 111/63 | HR 86 | Ht 72.0 in | Wt 318.6 lb

## 2022-09-05 DIAGNOSIS — E559 Vitamin D deficiency, unspecified: Secondary | ICD-10-CM

## 2022-09-05 DIAGNOSIS — E538 Deficiency of other specified B group vitamins: Secondary | ICD-10-CM | POA: Diagnosis not present

## 2022-09-05 DIAGNOSIS — J449 Chronic obstructive pulmonary disease, unspecified: Secondary | ICD-10-CM | POA: Diagnosis not present

## 2022-09-05 DIAGNOSIS — R7303 Prediabetes: Secondary | ICD-10-CM

## 2022-09-05 DIAGNOSIS — E785 Hyperlipidemia, unspecified: Secondary | ICD-10-CM | POA: Diagnosis not present

## 2022-09-05 DIAGNOSIS — D751 Secondary polycythemia: Secondary | ICD-10-CM

## 2022-09-05 DIAGNOSIS — R5383 Other fatigue: Secondary | ICD-10-CM | POA: Diagnosis not present

## 2022-09-05 DIAGNOSIS — F259 Schizoaffective disorder, unspecified: Secondary | ICD-10-CM

## 2022-09-05 DIAGNOSIS — F1910 Other psychoactive substance abuse, uncomplicated: Secondary | ICD-10-CM | POA: Insufficient documentation

## 2022-09-05 DIAGNOSIS — F418 Other specified anxiety disorders: Secondary | ICD-10-CM

## 2022-09-05 HISTORY — DX: Other psychoactive substance abuse, uncomplicated: F19.10

## 2022-09-05 MED ORDER — SILDENAFIL CITRATE 50 MG PO TABS: 50.0000 mg | ORAL_TABLET | Freq: Every day | ORAL | 0 refills | Status: DC | PRN

## 2022-09-05 MED ORDER — BREZTRI AEROSPHERE 160-9-4.8 MCG/ACT IN AERO: 2.0000 | INHALATION_SPRAY | Freq: Two times a day (BID) | RESPIRATORY_TRACT | 5 refills | Status: DC

## 2022-09-06 LAB — LIPID PANEL
Chol/HDL Ratio: 3.9 ratio (ref 0.0–5.0)
Cholesterol, Total: 133 mg/dL (ref 100–199)
HDL: 34 mg/dL — ABNORMAL LOW (ref >39)
LDL Chol Calc (NIH): 69 mg/dL (ref 0–99)
Triglycerides: 174 mg/dL — ABNORMAL HIGH (ref 0–149)
VLDL Cholesterol Cal: 30 mg/dL (ref 5–40)

## 2022-09-06 LAB — CMP14+EGFR
ALT: 44 IU/L (ref 0–44)
AST: 34 IU/L (ref 0–40)
Albumin: 4.7 g/dL (ref 4.1–5.1)
Alkaline Phosphatase: 79 IU/L (ref 44–121)
BUN/Creatinine Ratio: 12 (ref 9–20)
BUN: 11 mg/dL (ref 6–20)
Bilirubin Total: 0.5 mg/dL (ref 0.0–1.2)
CO2: 19 mmol/L — ABNORMAL LOW (ref 20–29)
Calcium: 10.1 mg/dL (ref 8.7–10.2)
Chloride: 97 mmol/L (ref 96–106)
Creatinine, Ser: 0.92 mg/dL (ref 0.76–1.27)
Globulin, Total: 2.3 g/dL (ref 1.5–4.5)
Glucose: 75 mg/dL (ref 70–99)
Potassium: 4.4 mmol/L (ref 3.5–5.2)
Sodium: 135 mmol/L (ref 134–144)
Total Protein: 7 g/dL (ref 6.0–8.5)
eGFR: 109 mL/min/{1.73_m2} (ref >59)

## 2022-09-06 LAB — TSH: TSH: 1.22 u[IU]/mL (ref 0.450–4.500)

## 2022-09-06 LAB — HEMOGLOBIN A1C
Est. average glucose Bld gHb Est-mCnc: 114 mg/dL
Hgb A1c MFr Bld: 5.6 % (ref 4.8–5.6)

## 2022-09-06 LAB — VITAMIN D 25 HYDROXY (VIT D DEFICIENCY, FRACTURES): Vit D, 25-Hydroxy: 29.3 ng/mL — ABNORMAL LOW (ref 30.0–100.0)

## 2022-09-06 LAB — VITAMIN B12: Vitamin B-12: 536 pg/mL (ref 232–1245)

## 2022-09-17 ENCOUNTER — Encounter: Payer: Self-pay | Admitting: Family

## 2022-09-17 NOTE — Assessment & Plan Note (Signed)
Checking labs today.  Continue current therapy for lipid control. Will modify as needed based on labwork results.  

## 2022-09-17 NOTE — Assessment & Plan Note (Signed)
Patient is seen by psychiatry, who manage this condition.  He is well controlled with current therapy.   Will defer to them for further changes to plan of care.

## 2022-09-17 NOTE — Assessment & Plan Note (Signed)
Sending referral to pulmonology.  Asked pt to continue current meds  Reassess at follow up.

## 2022-09-17 NOTE — Assessment & Plan Note (Signed)
Continue current meds.  Will adjust as needed based on results.  The patient is asked to make an attempt to improve diet and exercise patterns to aid in medical management of this problem. Addressed importance of increasing and maintaining water intake.   

## 2022-09-17 NOTE — Progress Notes (Signed)
Established Patient Office Visit  Subjective:  Patient ID: Jeffrey Velasquez, male    DOB: 12/26/1985  Age: 37 y.o. MRN: [id number]  No chief complaint on file.   Patient here with need for a referral to pulmonology.  He has been having additional shortness of breath and coughing.  He has been using the inhalers, but it has not improved.   No other concerns at this time.   Past Medical History:  Diagnosis Date   Anxiety    Depression    Hyperlipidemia    Polysubstance abuse (HCC) 09/05/2022   Schizophrenia (HCC)     History reviewed. No pertinent surgical history.  Social History   Socioeconomic History   Marital status: Single    Spouse name: Not on file   Number of children: Not on file   Years of education: Not on file   Highest education level: Not on file  Occupational History   Not on file  Tobacco Use   Smoking status: Every Day    Current packs/day: 1.00    Types: Cigarettes   Smokeless tobacco: Never  Vaping Use   Vaping status: Never Used  Substance and Sexual Activity   Alcohol use: Yes    Comment: 3 four lokos 06/24/19   Drug use: Not Currently   Sexual activity: Not on file  Other Topics Concern   Not on file  Social History Narrative   Not on file   Social Determinants of Health   Financial Resource Strain: Not on file  Food Insecurity: Not on file  Transportation Needs: Not on file  Physical Activity: Not on file  Stress: Not on file  Social Connections: Not on file  Intimate Partner Violence: Not on file    History reviewed. No pertinent family history.  Allergies  Allergen Reactions   Clonazepam    Shellfish Allergy     Review of Systems  Respiratory:  Positive for cough and shortness of breath.   All other systems reviewed and are negative.      Objective:   BP 111/63   Pulse 86   Ht 6' (1.829 m)   Wt (!) 318 lb 9.6 oz (144.5 kg)   SpO2 96%   BMI 43.21 kg/m   Vitals:   09/05/22 1324  BP: 111/63  Pulse:  86  Height: 6' (1.829 m)  Weight: (!) 318 lb 9.6 oz (144.5 kg)  SpO2: 96%  BMI (Calculated): 43.2    Physical Exam Vitals and nursing note reviewed.  Constitutional:      Appearance: Normal appearance. He is normal weight.  Eyes:     Pupils: Pupils are equal, round, and reactive to light.  Cardiovascular:     Rate and Rhythm: Normal rate and regular rhythm.     Pulses: Normal pulses.     Heart sounds: Normal heart sounds.  Pulmonary:     Effort: Pulmonary effort is normal.     Breath sounds: Normal breath sounds.  Neurological:     General: No focal deficit present.     Mental Status: He is alert and oriented to person, place, and time. Mental status is at baseline.  Psychiatric:        Mood and Affect: Mood normal.        Behavior: Behavior normal.        Thought Content: Thought content normal.      Results for orders placed or performed in visit on 09/05/22  Lipid panel  Result Value Ref Range  Cholesterol, Total 133 100 - 199 mg/dL   Triglycerides 161 (H) 0 - 149 mg/dL   HDL 34 (L) >09 mg/dL   VLDL Cholesterol Cal 30 5 - 40 mg/dL   LDL Chol Calc (NIH) 69 0 - 99 mg/dL   Chol/HDL Ratio 3.9 0.0 - 5.0 ratio  VITAMIN D 25 Hydroxy (Vit-D Deficiency, Fractures)  Result Value Ref Range   Vit D, 25-Hydroxy 29.3 (L) 30.0 - 100.0 ng/mL  CMP14+EGFR  Result Value Ref Range   Glucose 75 70 - 99 mg/dL   BUN 11 6 - 20 mg/dL   Creatinine, Ser 6.04 0.76 - 1.27 mg/dL   eGFR 540 >98 JX/BJY/7.82   BUN/Creatinine Ratio 12 9 - 20   Sodium 135 134 - 144 mmol/L   Potassium 4.4 3.5 - 5.2 mmol/L   Chloride 97 96 - 106 mmol/L   CO2 19 (L) 20 - 29 mmol/L   Calcium 10.1 8.7 - 10.2 mg/dL   Total Protein 7.0 6.0 - 8.5 g/dL   Albumin 4.7 4.1 - 5.1 g/dL   Globulin, Total 2.3 1.5 - 4.5 g/dL   Bilirubin Total 0.5 0.0 - 1.2 mg/dL   Alkaline Phosphatase 79 44 - 121 IU/L   AST 34 0 - 40 IU/L   ALT 44 0 - 44 IU/L  TSH  Result Value Ref Range   TSH 1.220 0.450 - 4.500 uIU/mL  Hemoglobin  A1c  Result Value Ref Range   Hgb A1c MFr Bld 5.6 4.8 - 5.6 %   Est. average glucose Bld gHb Est-mCnc 114 mg/dL  Vitamin N56  Result Value Ref Range   Vitamin B-12 536 232 - 1,245 pg/mL    Recent Results (from the past 2160 hour(s))  Lipid panel     Status: Abnormal   Collection Time: 09/05/22  1:52 PM  Result Value Ref Range   Cholesterol, Total 133 100 - 199 mg/dL   Triglycerides 213 (H) 0 - 149 mg/dL   HDL 34 (L) >08 mg/dL   VLDL Cholesterol Cal 30 5 - 40 mg/dL   LDL Chol Calc (NIH) 69 0 - 99 mg/dL   Chol/HDL Ratio 3.9 0.0 - 5.0 ratio    Comment:                                   T. Chol/HDL Ratio                                             Men  Women                               1/2 Avg.Risk  3.4    3.3                                   Avg.Risk  5.0    4.4                                2X Avg.Risk  9.6    7.1  3X Avg.Risk 23.4   11.0   VITAMIN D 25 Hydroxy (Vit-D Deficiency, Fractures)     Status: Abnormal   Collection Time: 09/05/22  1:52 PM  Result Value Ref Range   Vit D, 25-Hydroxy 29.3 (L) 30.0 - 100.0 ng/mL    Comment: Vitamin D deficiency has been defined by the Institute of Medicine and an Endocrine Society practice guideline as a level of serum 25-OH vitamin D less than 20 ng/mL (1,2). The Endocrine Society went on to further define vitamin D insufficiency as a level between 21 and 29 ng/mL (2). 1. IOM (Institute of Medicine). 2010. Dietary reference    intakes for calcium and D. [place]: The    [employer]. 2. Holick MF, Binkley Urbanna, Bischoff-Ferrari HA, et al.    Evaluation, treatment, and prevention of vitamin D    deficiency: an Endocrine Society clinical practice    guideline. JCEM. 2011 Jul; 96(7):1911-30.   CMP14+EGFR     Status: Abnormal   Collection Time: 09/05/22  1:52 PM  Result Value Ref Range   Glucose 75 70 - 99 mg/dL   BUN 11 6 - 20 mg/dL   Creatinine, Ser 6.57 0.76 - 1.27 mg/dL   eGFR 846  >96 EX/BMW/4.13   BUN/Creatinine Ratio 12 9 - 20   Sodium 135 134 - 144 mmol/L   Potassium 4.4 3.5 - 5.2 mmol/L   Chloride 97 96 - 106 mmol/L   CO2 19 (L) 20 - 29 mmol/L   Calcium 10.1 8.7 - 10.2 mg/dL   Total Protein 7.0 6.0 - 8.5 g/dL   Albumin 4.7 4.1 - 5.1 g/dL   Globulin, Total 2.3 1.5 - 4.5 g/dL   Bilirubin Total 0.5 0.0 - 1.2 mg/dL   Alkaline Phosphatase 79 44 - 121 IU/L   AST 34 0 - 40 IU/L   ALT 44 0 - 44 IU/L  TSH     Status: None   Collection Time: 09/05/22  1:52 PM  Result Value Ref Range   TSH 1.220 0.450 - 4.500 uIU/mL  Hemoglobin A1c     Status: None   Collection Time: 09/05/22  1:52 PM  Result Value Ref Range   Hgb A1c MFr Bld 5.6 4.8 - 5.6 %    Comment:          Prediabetes: 5.7 - 6.4          Diabetes: >6.4          Glycemic control for adults with diabetes: <7.0    Est. average glucose Bld gHb Est-mCnc 114 mg/dL  Vitamin K44     Status: None   Collection Time: 09/05/22  1:52 PM  Result Value Ref Range   Vitamin B-12 536 232 - 1,245 pg/mL       Assessment & Plan:   Problem List Items Addressed This Visit       Active Problems   COPD (chronic obstructive pulmonary disease) (HCC) - Primary    Sending referral to pulmonology.  Asked pt to continue current meds  Reassess at follow up.       Relevant Medications   BREZTRI AEROSPHERE 160-9-4.8 MCG/ACT AERO   Other Relevant Orders   Ambulatory referral to Pulmonology   CMP14+EGFR (Completed)   CBC with Differential/Platelet   Depression with anxiety    Patient is seen by psychiatry, who manage this condition.  He is well controlled with current therapy.   Will defer to them for further changes to plan of care.  Relevant Medications   buPROPion (WELLBUTRIN XL) 300 MG 24 hr tablet   Hyperlipidemia    Checking labs today.  Continue current therapy for lipid control. Will modify as needed based on labwork results.       Relevant Medications   sildenafil (VIAGRA) 50 MG tablet    cloNIDine (CATAPRES) 0.1 MG tablet   Other Relevant Orders   Lipid panel (Completed)   CMP14+EGFR (Completed)   CBC with Differential/Platelet   Obesity, morbid (HCC)    Continue current meds.  Will adjust as needed based on results.  The patient is asked to make an attempt to improve diet and exercise patterns to aid in medical management of this problem. Addressed importance of increasing and maintaining water intake.        Relevant Medications   metFORMIN (GLUCOPHAGE) 500 MG tablet   Other Relevant Orders   CMP14+EGFR (Completed)   CBC with Differential/Platelet   Schizoaffective disorder [facility])    Patient is seen by Psychiatry, who manage this condition.  He is well controlled with current therapy.   Will defer to them for further changes to plan of care.       Polycythemia, secondary   Relevant Orders   CMP14+EGFR (Completed)   CBC with Differential/Platelet   Other Visit Diagnoses     B12 deficiency due to diet       Checking labs today.  Will continue supplements as needed.   Relevant Orders   CMP14+EGFR (Completed)   Vitamin B12 (Completed)   CBC with Differential/Platelet   Vitamin D deficiency, unspecified       Checking labs today.  Will continue supplements as needed.   Relevant Orders   VITAMIN D 25 Hydroxy (Vit-D Deficiency, Fractures) (Completed)   CMP14+EGFR (Completed)   CBC with Differential/Platelet   Prediabetes       Checking labs today Patient counseled on dietary choices and verbalized understanding.   Relevant Orders   Hemoglobin A1c (Completed)   Other fatigue       Relevant Orders   TSH (Completed)       Return in about 3 months (around 12/06/2022).   Total time spent: 30 minutes  Miki Kins, FNP  09/05/2022   This document may have been prepared by [facility] Voice Recognition software and as such may include unintentional dictation errors.

## 2022-09-17 NOTE — Assessment & Plan Note (Signed)
Patient is seen by Psychiatry, who manage this condition.  He is well controlled with current therapy.   Will defer to them for further changes to plan of care.

## 2022-10-02 DIAGNOSIS — J449 Chronic obstructive pulmonary disease, unspecified: Secondary | ICD-10-CM | POA: Diagnosis not present

## 2022-10-02 DIAGNOSIS — J986 Disorders of diaphragm: Secondary | ICD-10-CM | POA: Diagnosis not present

## 2022-10-02 DIAGNOSIS — F17210 Nicotine dependence, cigarettes, uncomplicated: Secondary | ICD-10-CM | POA: Diagnosis not present

## 2022-10-02 DIAGNOSIS — R0602 Shortness of breath: Secondary | ICD-10-CM | POA: Diagnosis not present

## 2022-10-06 DIAGNOSIS — G4733 Obstructive sleep apnea (adult) (pediatric): Secondary | ICD-10-CM | POA: Diagnosis not present

## 2022-10-13 ENCOUNTER — Other Ambulatory Visit: Payer: Self-pay | Admitting: Specialist

## 2022-10-13 DIAGNOSIS — J449 Chronic obstructive pulmonary disease, unspecified: Secondary | ICD-10-CM

## 2022-10-13 DIAGNOSIS — R0602 Shortness of breath: Secondary | ICD-10-CM

## 2022-10-13 DIAGNOSIS — J986 Disorders of diaphragm: Secondary | ICD-10-CM

## 2022-10-17 ENCOUNTER — Ambulatory Visit
Admission: RE | Admit: 2022-10-17 | Discharge: 2022-10-17 | Disposition: A | Discharge status: Home / Self Care | Payer: 59 | Source: Ambulatory Visit | Attending: Specialist | Admitting: Specialist

## 2022-10-17 DIAGNOSIS — J986 Disorders of diaphragm: Secondary | ICD-10-CM | POA: Diagnosis not present

## 2022-10-17 DIAGNOSIS — J449 Chronic obstructive pulmonary disease, unspecified: Secondary | ICD-10-CM | POA: Insufficient documentation

## 2022-10-17 DIAGNOSIS — R0602 Shortness of breath: Secondary | ICD-10-CM | POA: Diagnosis not present

## 2022-10-31 DIAGNOSIS — R5382 Chronic fatigue, unspecified: Secondary | ICD-10-CM | POA: Diagnosis not present

## 2022-10-31 DIAGNOSIS — J986 Disorders of diaphragm: Secondary | ICD-10-CM | POA: Diagnosis not present

## 2022-10-31 DIAGNOSIS — G4733 Obstructive sleep apnea (adult) (pediatric): Secondary | ICD-10-CM | POA: Diagnosis not present

## 2022-10-31 DIAGNOSIS — R0609 Other forms of dyspnea: Secondary | ICD-10-CM | POA: Diagnosis not present

## 2022-11-06 DIAGNOSIS — J449 Chronic obstructive pulmonary disease, unspecified: Secondary | ICD-10-CM | POA: Diagnosis not present

## 2022-11-09 ENCOUNTER — Encounter: Payer: Self-pay | Admitting: Internal Medicine

## 2022-11-22 ENCOUNTER — Encounter: Payer: Self-pay | Admitting: Certified Registered"

## 2022-11-22 ENCOUNTER — Ambulatory Visit: Admission: RE | Admit: 2022-11-22 | Payer: 59 | Source: Home / Self Care | Admitting: Internal Medicine

## 2022-11-22 SURGERY — ESOPHAGOGASTRODUODENOSCOPY (EGD) WITH PROPOFOL
Anesthesia: General

## 2022-12-04 ENCOUNTER — Ambulatory Visit: Payer: 59 | Admitting: Family

## 2022-12-12 DIAGNOSIS — J439 Emphysema, unspecified: Secondary | ICD-10-CM | POA: Diagnosis not present

## 2022-12-12 DIAGNOSIS — R0609 Other forms of dyspnea: Secondary | ICD-10-CM | POA: Diagnosis not present

## 2022-12-12 DIAGNOSIS — F17298 Nicotine dependence, other tobacco product, with other nicotine-induced disorders: Secondary | ICD-10-CM | POA: Diagnosis not present

## 2022-12-12 DIAGNOSIS — G4733 Obstructive sleep apnea (adult) (pediatric): Secondary | ICD-10-CM | POA: Diagnosis not present

## 2022-12-13 ENCOUNTER — Other Ambulatory Visit: Payer: Self-pay

## 2022-12-13 ENCOUNTER — Encounter: Payer: Self-pay | Admitting: Emergency Medicine

## 2022-12-13 ENCOUNTER — Emergency Department
Admission: EM | Admit: 2022-12-13 | Discharge: 2022-12-14 | Disposition: A | Discharge status: Other Acute Inpatient Hospital | Payer: 59 | Attending: Emergency Medicine | Admitting: Emergency Medicine

## 2022-12-13 DIAGNOSIS — R441 Visual hallucinations: Secondary | ICD-10-CM | POA: Diagnosis present

## 2022-12-13 DIAGNOSIS — F1010 Alcohol abuse, uncomplicated: Secondary | ICD-10-CM | POA: Diagnosis present

## 2022-12-13 DIAGNOSIS — F250 Schizoaffective disorder, bipolar type: Secondary | ICD-10-CM | POA: Insufficient documentation

## 2022-12-13 DIAGNOSIS — F23 Brief psychotic disorder: Secondary | ICD-10-CM | POA: Diagnosis not present

## 2022-12-13 DIAGNOSIS — F17210 Nicotine dependence, cigarettes, uncomplicated: Secondary | ICD-10-CM | POA: Insufficient documentation

## 2022-12-13 DIAGNOSIS — Z91148 Patient's other noncompliance with medication regimen for other reason: Secondary | ICD-10-CM | POA: Diagnosis not present

## 2022-12-13 DIAGNOSIS — F419 Anxiety disorder, unspecified: Secondary | ICD-10-CM | POA: Insufficient documentation

## 2022-12-13 DIAGNOSIS — Y900 Blood alcohol level of less than 20 mg/100 ml: Secondary | ICD-10-CM | POA: Insufficient documentation

## 2022-12-13 DIAGNOSIS — F1210 Cannabis abuse, uncomplicated: Secondary | ICD-10-CM | POA: Insufficient documentation

## 2022-12-13 DIAGNOSIS — R4587 Impulsiveness: Secondary | ICD-10-CM | POA: Diagnosis not present

## 2022-12-13 DIAGNOSIS — R451 Restlessness and agitation: Secondary | ICD-10-CM | POA: Diagnosis not present

## 2022-12-13 DIAGNOSIS — R45850 Homicidal ideations: Secondary | ICD-10-CM | POA: Insufficient documentation

## 2022-12-13 DIAGNOSIS — R000 Tachycardia, unspecified: Secondary | ICD-10-CM | POA: Diagnosis not present

## 2022-12-13 LAB — COMPREHENSIVE METABOLIC PANEL
ALT: 57 U/L — ABNORMAL HIGH (ref 0–44)
AST: 53 U/L — ABNORMAL HIGH (ref 15–41)
Albumin: 4.9 g/dL (ref 3.5–5.0)
Alkaline Phosphatase: 63 U/L (ref 38–126)
Anion gap: 11 (ref 5–15)
BUN: 13 mg/dL (ref 6–20)
CO2: 20 mmol/L — ABNORMAL LOW (ref 22–32)
Calcium: 9.8 mg/dL (ref 8.9–10.3)
Chloride: 105 mmol/L (ref 98–111)
Creatinine, Ser: 0.96 mg/dL (ref 0.61–1.24)
GFR, Estimated: >60 mL/min (ref >60)
Glucose, Bld: 98 mg/dL (ref 70–99)
Potassium: 4.1 mmol/L (ref 3.5–5.1)
Sodium: 136 mmol/L (ref 135–145)
Total Bilirubin: 0.8 mg/dL (ref <1.2)
Total Protein: 8 g/dL (ref 6.5–8.1)

## 2022-12-13 LAB — ACETAMINOPHEN LEVEL: Acetaminophen (Tylenol), Serum: <10 ug/mL — ABNORMAL LOW (ref 10–30)

## 2022-12-13 LAB — URINE DRUG SCREEN, QUALITATIVE (ARMC ONLY)
Amphetamines, Ur Screen: NOT DETECTED
Barbiturates, Ur Screen: NOT DETECTED
Benzodiazepine, Ur Scrn: POSITIVE — AB
Cannabinoid 50 Ng, Ur ~~LOC~~: POSITIVE — AB
Cocaine Metabolite,Ur ~~LOC~~: NOT DETECTED
MDMA (Ecstasy)Ur Screen: NOT DETECTED
Methadone Scn, Ur: NOT DETECTED
Opiate, Ur Screen: NOT DETECTED
Phencyclidine (PCP) Ur S: NOT DETECTED
Tricyclic, Ur Screen: NOT DETECTED

## 2022-12-13 LAB — CBC
HCT: 51.7 % (ref 39.0–52.0)
Hemoglobin: 17.9 g/dL — ABNORMAL HIGH (ref 13.0–17.0)
MCH: 30.9 pg (ref 26.0–34.0)
MCHC: 34.6 g/dL (ref 30.0–36.0)
MCV: 89.1 fL (ref 80.0–100.0)
Platelets: 190 10*3/uL (ref 150–400)
RBC: 5.8 MIL/uL (ref 4.22–5.81)
RDW: 12.6 % (ref 11.5–15.5)
WBC: 13.1 10*3/uL — ABNORMAL HIGH (ref 4.0–10.5)
nRBC: 0 % (ref 0.0–0.2)

## 2022-12-13 LAB — SALICYLATE LEVEL: Salicylate Lvl: <7 mg/dL — ABNORMAL LOW (ref 7.0–30.0)

## 2022-12-13 LAB — ETHANOL: Alcohol, Ethyl (B): <10 mg/dL (ref <10)

## 2022-12-13 MED ORDER — HALOPERIDOL LACTATE 5 MG/ML IJ SOLN: 5.0000 mg | Freq: Four times a day (QID) | INTRAMUSCULAR | Status: DC | PRN

## 2022-12-13 MED ORDER — MIDAZOLAM HCL 2 MG/2ML IJ SOLN
4.0000 mg | Freq: Once | INTRAMUSCULAR | Status: AC
  Administered 2022-12-13: 4 mg via INTRAMUSCULAR
  Filled 2022-12-13: qty 4

## 2022-12-13 MED ORDER — BENZTROPINE MESYLATE 1 MG PO TABS
1.0000 mg | ORAL_TABLET | Freq: Two times a day (BID) | ORAL | Status: DC
  Administered 2022-12-13: 1 mg via ORAL
  Filled 2022-12-13: qty 1

## 2022-12-13 MED ORDER — LORAZEPAM 2 MG/ML IJ SOLN: 2.0000 mg | Freq: Four times a day (QID) | INTRAMUSCULAR | Status: DC | PRN

## 2022-12-13 MED ORDER — HALOPERIDOL 5 MG PO TABS: 5.0000 mg | ORAL_TABLET | Freq: Four times a day (QID) | ORAL | Status: DC | PRN

## 2022-12-13 MED ORDER — DROPERIDOL 2.5 MG/ML IJ SOLN
5.0000 mg | Freq: Once | INTRAMUSCULAR | Status: AC
  Administered 2022-12-13: 5 mg via INTRAMUSCULAR
  Filled 2022-12-13: qty 2

## 2022-12-13 MED ORDER — LORAZEPAM 1 MG PO TABS: 1.0000 mg | ORAL_TABLET | Freq: Once | ORAL | Status: DC

## 2022-12-13 MED ORDER — DIPHENHYDRAMINE HCL 25 MG PO CAPS: 50.0000 mg | ORAL_CAPSULE | Freq: Four times a day (QID) | ORAL | Status: DC | PRN

## 2022-12-13 MED ORDER — MIDAZOLAM HCL 2 MG/2ML IJ SOLN: 4.0000 mg | Freq: Once | INTRAMUSCULAR | Status: DC

## 2022-12-13 MED ORDER — CLONIDINE HCL 0.1 MG PO TABS
0.1000 mg | ORAL_TABLET | Freq: Two times a day (BID) | ORAL | Status: DC
  Administered 2022-12-13: 0.1 mg via ORAL
  Filled 2022-12-13: qty 1

## 2022-12-13 MED ORDER — DROPERIDOL 2.5 MG/ML IJ SOLN
5.0000 mg | Freq: Once | INTRAMUSCULAR | Status: DC
Start: 2022-12-13 — End: 2022-12-13

## 2022-12-13 MED ORDER — LORAZEPAM 2 MG PO TABS: 2.0000 mg | ORAL_TABLET | Freq: Four times a day (QID) | ORAL | Status: DC | PRN

## 2022-12-13 MED ORDER — BUPROPION HCL ER (XL) 150 MG PO TB24
300.0000 mg | ORAL_TABLET | Freq: Every day | ORAL | Status: DC
  Administered 2022-12-13: 300 mg via ORAL
  Filled 2022-12-13: qty 2

## 2022-12-13 MED ORDER — DIPHENHYDRAMINE HCL 50 MG/ML IJ SOLN: 50.0000 mg | Freq: Four times a day (QID) | INTRAMUSCULAR | Status: DC | PRN

## 2022-12-13 MED ORDER — FLUOXETINE HCL 20 MG PO CAPS
60.0000 mg | ORAL_CAPSULE | Freq: Every day | ORAL | Status: DC
  Administered 2022-12-13: 60 mg via ORAL
  Filled 2022-12-13: qty 3

## 2022-12-13 NOTE — ED Notes (Signed)
Pt awake, alert and ate lunch. Pt able to complete ADL's independently at this time. Pt calm and cooperative.

## 2022-12-13 NOTE — ED Notes (Signed)
IVC 

## 2022-12-13 NOTE — Consult Note (Cosign Needed Addendum)
Telepsych Consultation   Reason for Consult: Psych Consult  Referring Physician:   Corena Herter, MD   Location of Patient: [facility] ED Location of Provider: Other: [facility]  Patient Identification: Jeffrey Velasquez MRN:  [id number] Principal Diagnosis: Schizoaffective disorder, bipolar type (HCC) Diagnosis:  Principal Problem:   Schizoaffective disorder, bipolar type (HCC) Active Problems:   Alcohol abuse   Cannabis abuse   Total Time spent with patient: 30 minutes  Subjective:    Jeffrey Velasquez is a 37 y.o. Caucasian male with a past psychiatric history of schizoaffective disorder bipolar type, schizophrenia, and polysubstance abuse (I.e., ETOH, Cannabis), with pertinent medical comorbidities/history that include COPD, hyperlipidemia, fatty liver, obesity, secondary polycythemia, and leukocytosis, who presented this encounter by way of [facility] department under involuntary commitment, after the patient was found at the family home decompensated into psychosis and threatening to kill his parents, as well as in possession of multiple firearms with ammunition.  Patient remains under involuntary commitment at this time but is medically cleared per EDP team.  Per IVC taken out by the patient's mother Ms. Tina Griffiths   The respondent has been diagnosed as schizophrenic.  He gets a monthly shot but not sure the last one he had.  He went to get beer last night and came home and woke his mother up to talk. He uttering things like everyone is going to die and he is going to do it. He also stated he was going to kill his mother. He is having visual and auditory hallucinations through his cell phone.  HPI:   Patient seen today via telepsych consultation communications via a secure connection, while this provider was located at the [facility] emergency department, and the patient was located at the [facility] emergency department.  Upon  evaluation, patient endorses that last night he went to the store and bought cannabis and beer, proceeded to drink and smoke all night for many hours, then states he proceeded to, "confront my parents about all of the messages I was getting on my phone." Expanding on this, patient states that all last night prior to engagement with his parents he had been receiving a bunch of videos about angels, the rapture coming, and Bible quotes, as well as, "all kinds of messages speaking to me and fucking with me." Patient states that the, "messages" he has been receiving from his parents were the, "last straw", states that he is tired of living with them and them, "taking all of my money, I literally give them four-hundred dollars for rent and all of my food stamp money, I have already gave them nearly twelve-hundred dollars at this point, and they're not here to help me, they're trying to hold me back, they're after me, they know what's going on."    Patient endorses that last night when he confronted his parents he did not state that he was going to kill them, states that this is not true, but does affirm that he collected all of the guns and ammunition in the home and loaded them up in a vehicle; states he was going to take them to a pawn shop to pawn them to use the money to buy a "gun of my own I could use."  Expanding on the, "messages" he has been receiving from his parents, patient goes on to state that he has also been receiving messages from, "everyone"; states that people on Facebook, [place], through the TV, through his phone, and  even the hospital staff presently caring for him, are, "All in on it..they all know what's going on.Jackquline Bosch is starting a war because the rapture is coming in the [place], the antichrist will rise soon, and it is all going to be over."   Patient endorses current homicidal ideations, states "Honestly, I want to kill the fuckers messing with me, but if I can't do that, I just  want to be left alone."  Expanding on homicidal ideations, patient admits that if he could, "get a hold of everyone fucking with me" that he is very likely to harm them with a firearm. Patient endorses no auditory and or visual hallucinations, and objectively, does not appear to be presenting with psychosis and/or responding to internal stimuli.  Patient orientation was intact upon assessment, no concerns for fluctuations of consciousness.  Patient endorsed no suicidal ideations and denied history of suicide attempts or self injury.  Patient endorses that for about a week now he has not been taking his psychiatric medications, states that over the last week he has been drinking about a half case of beer a day and smoking a couple grams of weed, and so because of this, states that he has been forgetting to take his psychiatric medications given to him by his ACT team. Patient denies any psychosocial stressors outside of, "everyone fucking with me", states that actually his problem with living with his parents will be resolved soon, states that he will soon be moving into his own apartment that has been facilitated by his ACT team at [facility].  Patient endorses that he smokes tobacco daily, states that he has been smoking for as long as he can remember, smokes 2 packs of cigarettes a day. Patient reports that outside of recently binge drinking, states that he only has a few beers every couple days "if that" with what money he has left from his Social Security disability check; denies experiencing any EtOH withdrawal symptomology, as well as denies any history of severe withdraw from EtOH. Patient endorses no current and/or past history of drug use outside of cannabis; UDS positive for cannabis. BAL negative.   Collateral, [employer] ACT team, at [phone], spoke to [place] and [place] (staff at office)  Call placed and extensive conversation held with the patient's ACT team. Patient's ACT team reports  that they saw the patient the day before just prior to the incident that transpired which led to the patient being brought in and they state that the patient was not well, states that he was severely paranoid, very decompensated, and expressing odd and bizarre religiosity.  Patient's ACT team able to affirm the patient's medications that he takes for his mental health, able to affirm the patient received his LAI on November 27, 2022 as well.  Patient's ACT team reports that the patient has been relatively stable since last year, states that he has not been hospitalized since [facility] in 2023.  Patient's ACT team state that they see him twice a week, but because of the holiday of Thanksgiving, only saw him once last week early in the week before Thanksgiving. Patient's ACT team reports that it is very likely that the patient recently stopped taking his oral medications since they last saw him last week, thus he has decompensated unfortunately.  Patient's ACT team able to provide collateral about the events that transpired which led to the patient being brought in, states to this provider that the patient threatened to kill his parents and multiple  other people, thus the patient was brought in for safety. Discussed with the patient's ACT team that the recommendation today would be for inpatient mental health hospitalization and to restart his outpatient ACT team medications, to which they verbalized understanding.   Past Psychiatric History: Schizoaffective disorder bipolar type, schizophrenia, polysubstance abuse  Risk to Self: Yes Risk to Others: Yes Prior Inpatient Therapy: Multiple inpatient mental health hospitalization, last hospitalization at [facility] 2023 Prior Outpatient Therapy: [facility] team (Current)  Substance Abuse: Cannabis daily, "since I was young", currently couple grams daily current; ETOH, "off and on, but recently, about a half case of beer daily"; denies other drug use  Past Medical  History:  Past Medical History:  Diagnosis Date   Anxiety    Depression    Hyperlipidemia    Polysubstance abuse (HCC) 09/05/2022   Schizophrenia (HCC)    History reviewed. No pertinent surgical history. Family History: History reviewed. No pertinent family history. Family Psychiatric  History: None endorsed or reported Social History:  Social History   Substance and Sexual Activity  Alcohol Use Yes     Social History   Substance and Sexual Activity  Drug Use Yes   Types: Marijuana   Comment: Daily    Social History   Socioeconomic History   Marital status: Single    Spouse name: Not on file   Number of children: Not on file   Years of education: Not on file   Highest education level: Not on file  Occupational History   Not on file  Tobacco Use   Smoking status: Every Day    Current packs/day: 2.00    Types: Cigarettes   Smokeless tobacco: Never  Vaping Use   Vaping status: Never Used  Substance and Sexual Activity   Alcohol use: Yes   Drug use: Yes    Types: Marijuana    Comment: Daily   Sexual activity: Not on file  Other Topics Concern   Not on file  Social History Narrative   Not on file   Social Determinants of Health   Financial Resource Strain: Low Risk  (10/31/2022)   Received from [facility]   Overall Financial Resource Strain (CARDIA)    Difficulty of Paying Living Expenses: Not hard at all  Food Insecurity: No Food Insecurity (10/31/2022)   Received from [facility]   Hunger Vital Sign    Worried About Running Out of Food in the Last Year: Never true    Ran Out of Food in the Last Year: Never true  Transportation Needs: No Transportation Needs (10/31/2022)   Received from [facility] - Transportation    In the past 12 months, has lack of transportation kept you from medical appointments or from getting medications?: No    Lack of Transportation (Non-Medical): No  Physical  Activity: Not on file  Stress: Not on file  Social Connections: Not on file   Additional Social History:    Allergies:   Allergies  Allergen Reactions   Clonazepam    Shellfish Allergy     Labs:  Results for orders placed or performed during the hospital encounter of 12/13/22 (from the past 48 hour(s))  Comprehensive metabolic panel     Status: Abnormal   Collection Time: 12/13/22  9:47 AM  Result Value Ref Range   Sodium 136 135 - 145 mmol/L   Potassium 4.1 3.5 - 5.1 mmol/L   Chloride 105 98 - 111 mmol/L  CO2 20 (L) 22 - 32 mmol/L   Glucose, Bld 98 70 - 99 mg/dL    Comment: Glucose reference range applies only to samples taken after fasting for at least 8 hours.   BUN 13 6 - 20 mg/dL   Creatinine, Ser 1.61 0.61 - 1.24 mg/dL   Calcium 9.8 8.9 - 09.6 mg/dL   Total Protein 8.0 6.5 - 8.1 g/dL   Albumin 4.9 3.5 - 5.0 g/dL   AST 53 (H) 15 - 41 U/L   ALT 57 (H) 0 - 44 U/L   Alkaline Phosphatase 63 38 - 126 U/L   Total Bilirubin 0.8 <1.2 mg/dL   GFR, Estimated >04 >54 mL/min    Comment: (NOTE) Calculated using the CKD-EPI Creatinine Equation (2021)    Anion gap 11 5 - 15    Comment: Performed at [facility], [address] [place], [place] [address]  Ethanol     Status: None   Collection Time: 12/13/22  9:47 AM  Result Value Ref Range   Alcohol, Ethyl (B) <10 <10 mg/dL    Comment: (NOTE) Lowest detectable limit for serum alcohol is 10 mg/dL.  For medical purposes only. Performed at [facility], [address] [place], [place] [address]   Salicylate level     Status: Abnormal   Collection Time: 12/13/22  9:47 AM  Result Value Ref Range   Salicylate Lvl <7.0 (L) 7.0 - 30.0 mg/dL    Comment: Performed at [facility], [address] [place], [place] [address]  Acetaminophen level     Status: Abnormal   Collection Time: 12/13/22  9:47 AM  Result Value Ref Range   Acetaminophen (Tylenol), Serum <10 (L) 10 - 30 ug/mL    Comment:  (NOTE) Therapeutic concentrations vary significantly. A range of 10-30 ug/mL  may be an effective concentration for many patients. However, some  are best treated at concentrations outside of this range. Acetaminophen concentrations >150 ug/mL at 4 hours after ingestion  and >50 ug/mL at 12 hours after ingestion are often associated with  toxic reactions.  Performed at [facility], [address] [place], [place] [address]   CBC     Status: Abnormal   Collection Time: 12/13/22  9:47 AM  Result Value Ref Range   WBC 13.1 (H) 4.0 - 10.5 K/uL   RBC 5.80 4.22 - 5.81 MIL/uL   Hemoglobin 17.9 (H) 13.0 - 17.0 g/dL   HCT 57.8 46.9 - 62.9 %   MCV 89.1 80.0 - 100.0 fL   MCH 30.9 26.0 - 34.0 pg   MCHC 34.6 30.0 - 36.0 g/dL   RDW 52.8 41.3 - 24.4 %   Platelets 190 150 - 400 K/uL    Comment: REPEATED TO VERIFY   nRBC 0.0 0.0 - 0.2 %    Comment: Performed at [facility], [address] [place], [place] [address]  Urine Drug Screen, Qualitative     Status: Abnormal   Collection Time: 12/13/22  4:18 PM  Result Value Ref Range   Tricyclic, Ur Screen NONE DETECTED NONE DETECTED   Amphetamines, Ur Screen NONE DETECTED NONE DETECTED   MDMA (Ecstasy)Ur Screen NONE DETECTED NONE DETECTED   Cocaine Metabolite,Ur [place] NONE DETECTED NONE DETECTED   Opiate, Ur Screen NONE DETECTED NONE DETECTED   Phencyclidine (PCP) Ur S NONE DETECTED NONE DETECTED   Cannabinoid 50 Ng, Ur [place] POSITIVE (A) NONE DETECTED   Barbiturates, Ur Screen NONE DETECTED NONE DETECTED   Benzodiazepine,  Ur Scrn POSITIVE (A) NONE DETECTED   Methadone Scn, Ur NONE DETECTED NONE DETECTED    Comment: (NOTE) Tricyclics + metabolites, urine    Cutoff 1000 ng/mL Amphetamines + metabolites, urine  Cutoff 1000 ng/mL MDMA (Ecstasy), urine              Cutoff 500 ng/mL Cocaine Metabolite, urine          Cutoff 300 ng/mL Opiate + metabolites, urine        Cutoff 300 ng/mL Phencyclidine (PCP), urine         Cutoff 25  ng/mL Cannabinoid, urine                 Cutoff 50 ng/mL Barbiturates + metabolites, urine  Cutoff 200 ng/mL Benzodiazepine, urine              Cutoff 200 ng/mL Methadone, urine                   Cutoff 300 ng/mL  The urine drug screen provides only a preliminary, unconfirmed analytical test result and should not be used for non-medical purposes. Clinical consideration and professional judgment should be applied to any positive drug screen result due to possible interfering substances. A more specific alternate chemical method must be used in order to obtain a confirmed analytical result. Gas chromatography / mass spectrometry (GC/MS) is the preferred confirm atory method. Performed at [facility], [address] [place], [place] [address]     Medications:  No current facility-administered medications for this encounter.   Current Outpatient Medications  Medication Sig Dispense Refill   ARIPiprazole (ABILIFY) 10 MG tablet Take 10 mg by mouth daily.     ARIPiprazole ER (ABILIFY MAINTENA) 400 MG SRER injection Inject 400 mg into the muscle every 28 (twenty-eight) days.     atorvastatin (LIPITOR) 40 MG tablet Take 1 tablet (40 mg total) by mouth daily. 90 tablet 3   benztropine (COGENTIN) 1 MG tablet Take 1 mg by mouth 2 (two) times daily.     buPROPion (WELLBUTRIN XL) 300 MG 24 hr tablet Take 300 mg by mouth daily.     cloNIDine (CATAPRES) 0.1 MG tablet Take 0.1 mg by mouth 2 (two) times daily.     FLUoxetine (PROZAC) 20 MG capsule Take 60 mg by mouth every morning.     lisinopril (ZESTRIL) 5 MG tablet TAKE 1 TABLET BY MOUTH DAILY FOR HYPERTENSION 90 tablet 3   metFORMIN (GLUCOPHAGE) 500 MG tablet Take 500 mg by mouth daily.     pantoprazole (PROTONIX) 40 MG tablet Take 40 mg by mouth 2 (two) times daily.     ABILIFY MAINTENA 400 MG PRSY prefilled syringe Inject 400 mg into the muscle every 30 (thirty) days. (Patient not taking: Reported on 12/13/2022)     ARIPiprazole  (ABILIFY) 5 MG tablet Take 5 mg by mouth daily. (Patient not taking: Reported on 12/13/2022)     BREZTRI AEROSPHERE 160-9-4.8 MCG/ACT AERO Inhale 2 puffs into the lungs in the morning and at bedtime. 10.7 g 5   OLANZapine (ZYPREXA) 10 MG tablet Take 10 mg by mouth at bedtime.     propranolol (INDERAL) 10 MG tablet Take 10 mg by mouth 2 (two) times daily.     sildenafil (VIAGRA) 50 MG tablet Take 1 tablet (50 mg total) by mouth daily as needed for erectile dysfunction. 10 tablet 0    Musculoskeletal: Strength & Muscle Tone: within normal limits Gait & Station: normal Patient leans: N/A  Psychiatric Specialty Exam:  Presentation  General Appearance:  Appropriate for Environment  Eye Contact: Good  Speech: Clear and Coherent; Normal Rate  Speech Volume: Normal  Handedness: Right   Mood and Affect  Mood: Dysphoric  Affect: Constricted; Congruent   Thought Process  Thought Processes: Coherent  Descriptions of Associations:Circumstantial  Orientation:Full (Time, Place and Person)  Thought Content:Illogical; Paranoid Ideation; Delusions; Other (comment) (Religioisity)  History of Schizophrenia/Schizoaffective disorder:Yes  Duration of Psychotic Symptoms:Greater than six months  Hallucinations:Hallucinations: None  Ideas of Reference:Paranoia; Percusatory; Delusions  Suicidal Thoughts:Suicidal Thoughts: No  Homicidal Thoughts:Homicidal Thoughts: Yes, Active HI Active Intent and/or Plan: With Access to Means; With Means to Carry Out; With Plan; With Intent   Sensorium  Memory: Immediate Fair; Recent Fair; Remote Fair  Judgment: Impaired  Insight: Poor; Lacking   Executive Functions  Concentration: Fair  Attention Span: Fair  Recall: [employer] of Knowledge: Fair  Language: Fair   Psychomotor Activity  Psychomotor Activity: Psychomotor Activity: Normal   Assets  Assets: Health and safety inspector; Housing; Leisure Time;  Resilience; Physical Health; Social Support; Transportation   Sleep  Sleep: Sleep: Fair    Physical Exam: Physical Exam Vitals and nursing note reviewed.  Constitutional:      General: He is not in acute distress.    Appearance: He is obese. He is not ill-appearing, toxic-appearing or diaphoretic.  Pulmonary:     Effort: Pulmonary effort is normal.  Neurological:     Mental Status: He is alert and oriented to person, place, and time.     Motor: No tremor or seizure activity.  Psychiatric:        Attention and Perception: Attention and perception normal. He does not perceive auditory or visual hallucinations.        Mood and Affect: Affect is angry.        Speech: Speech normal.        Behavior: Behavior is agitated. Behavior is cooperative.        Thought Content: Thought content is paranoid and delusional. Thought content includes homicidal ideation. Thought content does not include suicidal ideation. Thought content includes homicidal (Reports desire to shoot "the fuckers messing with me") plan. Thought content does not include suicidal plan.        Cognition and Memory: Cognition and memory normal.        Judgment: Judgment is impulsive and inappropriate.    Review of Systems  Psychiatric/Behavioral:  Positive for substance abuse (ETOH, Cannabis). Negative for depression, hallucinations and suicidal ideas. The patient is not nervous/anxious and does not have insomnia.   All other systems reviewed and are negative.  Blood pressure (!) 157/78, pulse (!) 114, temperature 98 F (36.7 C), temperature source Oral, resp. rate 20, weight (!) 144.5 kg, SpO2 95%. Body mass index is 43.21 kg/m.  Treatment Plan Summary:  Patient presented this encounter by way of [place] [facility] department under involuntary commitment, after the patient was found at the family home decompensated into psychosis and threatening to kill his parents, as well as in possession of multiple firearms with  ammunition.  Upon evaluation, as well as collateral obtained, patient presents with symptomology that is most consistent with an acute decompensation of the patient's chronic illness course of schizoaffective disorder bipolar type, in the context of medication noncompliance and substance abuse. Given patient's severe religious and paranoid delusional themes, and endorsements of homicidal ideations, and medication noncompliance, will recommend inpatient mental health hospitalization at this time.  CSW team to fax out and  to [facility] to review.  Psychiatry will continue to follow the patient until disposition is obtained.  Recommendations  #Schizoaffective disorder, bipolar type (HCC) #Alcohol abuse #Cannabis abuse  -Recommend inpatient mental health hospitalization -Recommend restart outpatient psychiatric medications listed below -->Abilify maintena 400 mg IM LAI; last injection November 27, 2022 (NOT DUE) --> Clonidine 0.1 mg p.o. twice daily --> Prozac 60 mg p.o. nightly --> Cogentin 1 mg twice daily --> Wellbutrin XL 300 mg p.o. daily -Recommend agitation protocol/safety protocols -Recommend continue involuntary commitment  Disposition: Recommend psychiatric Inpatient admission when medically cleared.  This service was provided via telemedicine using a 2-way, interactive audio and video technology.  Names of all persons participating in this telemedicine service and their role in this encounter. Name: Arsenio Loader  Role: PMHNP-BC  Name: Salley Hews Role: RN  Name: Dr. Anner Crete Role: EDP  Name: Role:     Lenox Ponds, NP 12/13/2022 6:35 PM

## 2022-12-13 NOTE — ED Notes (Signed)
Arrives to room in bilateral upper extremity forensic cuffs by [facility] who is at bedside. Pt cursing at RN, making verbal threats that "we are all going to die" and will not elaborate on why he is here. At bedside with Dr. Arnoldo Morale, who updated patient as to why he is here. Pt calling EDP "cannibal" and making verbal threats.

## 2022-12-13 NOTE — ED Notes (Addendum)
Patient having telehealth visit with Arsenio Loader, NP on psych telehealth cart 1 at this time.

## 2022-12-13 NOTE — ED Triage Notes (Signed)
Arrives with [facility] Deputy.  Under IVC paperwork.  Per LE, patient threatening parents last night.  Patient was loading up a car with guns and ammunition.  Then patient left on his scooter with a backpack full of ammunition.    Patient states he ws going to [place].  Stating "the whole world is going to get blown up. WWIII is coming"  History of bipolar and schizophrenia.  Patient states "I am not the sinner, you are"

## 2022-12-13 NOTE — ED Provider Notes (Signed)
-----------------------------------------   5:44 PM on 12/13/2022 -----------------------------------------   Blood pressure (!) 157/78, pulse (!) 114, temperature 98 F (36.7 C), temperature source Oral, resp. rate 20, weight (!) 144.5 kg, SpO2 95%.  The patient is calm and cooperative at this time.  Psychiatry recommending admission.  They have placed additional orders for psychiatric medications.  Continue IVC.   Janith Lima, MD 12/13/22 [phone]

## 2022-12-13 NOTE — ED Notes (Signed)
Pt attempting to Scientist, research (physical sciences).

## 2022-12-13 NOTE — ED Notes (Signed)
Lunch provided.

## 2022-12-13 NOTE — BH Assessment (Signed)
Comprehensive Clinical Assessment (CCA) Note  12/13/2022 Jeffrey Velasquez [id number]  Chief Complaint: Patient is a 37 year old male presenting to [facility] ED under IVC. Per triage note Arrives with [facility].  Under IVC paperwork.  Per LE, patient threatening parents last night.  Patient was loading up a car with guns and ammunition.  Then patient left on his scooter with a backpack full of ammunition. Patient states he ws going to [place].  Stating "the whole world is going to get blown up. WWIII is coming". History of bipolar and schizophrenia. Patient states "I am not the sinner, you are." During assessment patient appears alert and oriented x2, calm and cooperative. Patient reports HI towards the staff "I want to hurt yall, yall are the devil." Patient also reports VH "I see things on my phone, on my social media, on facebook, on instagram." Earlier patient had expressed some paranoia and delusions of his parents where he believed that they were taking his money and "they're trying to hold me back, they're after me, they know what's going on." Patient had also reported to Psyc NP that he had been receiving messages on his phone and a bunch of videos about angels and the rapture coming. Patient denies SI/AH.  Per Psyc NP Arsenio Loader patient is recommended for Inpatient Chief Complaint  Patient presents with   Schizophrenia   Visit Diagnosis: Schizoaffective disorder, bipolar type. Alcohol abuse. Cannabis abuse    CCA Screening, Triage and Referral (STR)  Patient Reported Information How did you hear about [place]? Legal System  Referral name: No data recorded Referral phone number: No data recorded  Whom do you see for routine medical problems? No data recorded Practice/Facility Name: No data recorded Practice/Facility Phone Number: No data recorded Name of Contact: No data recorded Contact Number: No data recorded Contact Fax Number: No data recorded Prescriber Name: No  data recorded Prescriber Address (if known): No data recorded  What Is the Reason for Your Visit/Call Today? Arrives with [facility] Deputy.  Under IVC paperwork.  Per LE, patient threatening parents last night.  Patient was loading up a car with guns and ammunition.  Then patient left on his scooter with a backpack full of ammunition.       Patient states he ws going to [place].  Stating "the whole world is going to get blown up. WWIII is coming"     History of bipolar and schizophrenia.     Patient states "I am not the sinner, you are"  How Long Has This Been Causing You Problems? > than 6 months  What Do You Feel Would Help You the Most Today? No data recorded  Have You Recently Been in Any Inpatient Treatment (Hospital/Detox/Crisis Center/28-Day Program)? No data recorded Name/Location of Program/Hospital:No data recorded How Long Were You There? No data recorded When Were You Discharged? No data recorded  Have You Ever Received Services From [facility] Before? No data recorded Who Do You See at [facility]? No data recorded  Have You Recently Had Any Thoughts About Hurting Yourself? No  Are You Planning to Commit Suicide/Harm Yourself At This time? No   Have you Recently Had Thoughts About Hurting Someone [name]? Yes  Explanation: Patient reports "I want to hurt yall"   Have You Used Any Alcohol or Drugs in the Past 24 Hours? No data recorded How Long Ago Did You Use Drugs or Alcohol? No data recorded What Did You Use and How Much? No data recorded  Do You Currently Have a Therapist/Psychiatrist? No data recorded Name of Therapist/Psychiatrist: No data recorded  Have You Been Recently Discharged From Any Office Practice or Programs? No  Explanation of Discharge From Practice/Program: No data recorded    CCA Screening Triage Referral Assessment Type of Contact: Face-to-Face  Is this Initial or Reassessment? No data recorded Date Telepsych consult ordered in [facility]:  No  data recorded Time Telepsych consult ordered in [facility]:  No data recorded  Patient Reported Information Reviewed? No data recorded Patient Left Without Being Seen? No data recorded Reason for Not Completing Assessment: No data recorded  Collateral Involvement: No data recorded  Does Patient Have a Court Appointed Legal Guardian? No data recorded Name and Contact of Legal Guardian: No data recorded If Minor and Not Living with Parent(s), Who has Custody? No data recorded Is CPS involved or ever been involved? Never  Is APS involved or ever been involved? Never   Patient Determined To Be At Risk for Harm To Self or Others Based on Review of Patient Reported Information or Presenting Complaint? Yes, for Harm to Others  Method: No data recorded Availability of Means: No data recorded Intent: No data recorded Notification Required: No data recorded Additional Information for Danger to Others Potential: No data recorded Additional Comments for Danger to Others Potential: No data recorded Are There Guns or Other Weapons in Your Home? Yes  Types of Guns/Weapons: Patient had weapons in his car  Are These Weapons Safely Secured?                            -- (Unknown)  Who Could Verify You Are Able To Have These Secured: Unknown if the paitent's weapons were taken by police or not  Do You Have any Outstanding Charges, Pending Court Dates, Parole/Probation? No data recorded Contacted To Inform of Risk of Harm To Self or Others: No data recorded  Location of Assessment: [facility] ED   Does Patient Present under Involuntary Commitment? Yes  IVC Papers Initial File Date: No data recorded  [place] of Residence: [place]   Patient Currently Receiving the Following Services: No data recorded  Determination of Need: Emergent (2 hours)   Options For Referral: No data recorded    CCA Biopsychosocial Intake/Chief Complaint:  No data recorded Current Symptoms/Problems: No data  recorded  Patient Reported Schizophrenia/Schizoaffective Diagnosis in Past: Yes   Strengths: Patient is able to communicate his needs  Preferences: No data recorded Abilities: No data recorded  Type of Services Patient Feels are Needed: No data recorded  Initial Clinical Notes/Concerns: No data recorded  Mental Health Symptoms Depression:   Irritability   Duration of Depressive symptoms:  Greater than two weeks   Mania:   Irritability; Racing thoughts; Recklessness   Anxiety:    Difficulty concentrating; Fatigue; Irritability; Restlessness; Worrying   Psychosis:   Delusions; Hallucinations   Duration of Psychotic symptoms:  Greater than six months   Trauma:   None   Obsessions:   Poor insight; Recurrent & persistent thoughts/impulses/images   Compulsions:   Absent insight/delusional; Poor Insight; Repeated behaviors/mental acts; "Driven" to perform behaviors/acts   Inattention:   None   Hyperactivity/Impulsivity:   None   Oppositional/Defiant Behaviors:   Aggression towards people/animals; Argumentative; Intentionally annoying; Resentful; Spiteful; Temper   Emotional Irregularity:   Transient, stress-related paranoia/disassociation   Other Mood/Personality Symptoms:  No data recorded   Mental Status Exam Appearance and self-care  Stature:   Average  Weight:   Overweight   Clothing:   Disheveled   Grooming:   Neglected   Cosmetic use:   None   Posture/gait:   Normal   Motor activity:   Not Remarkable   Sensorium  Attention:   Normal   Concentration:   Focuses on irrelevancies   Orientation:   Person; Place   Recall/memory:   Defective in Short-term; Defective in Recent   Affect and Mood  Affect:   Blunted; Flat   Mood:   Irritable   Relating  Eye contact:   Avoided   Facial expression:   Responsive   Attitude toward examiner:   Guarded; Irritable   Thought and Language  Speech flow:  Other (Comment)    Thought content:   Delusions   Preoccupation:   Homicidal; Ruminations   Hallucinations:   Visual   Organization:  No data recorded  [employer] of Knowledge:   Fair   Intelligence:   Average   Abstraction:   Abstract   Judgement:   Poor   Reality Testing:   Distorted   Insight:   Lacking; Poor   Decision Making:   Impulsive   Social Functioning  Social Maturity:   Impulsive   Social Judgement:   Heedless   Stress  Stressors:   Illness   Coping Ability:   Contractor Deficits:   None   Supports:   Support needed     Religion:    Leisure/Recreation: Leisure / Recreation Do You Have Hobbies?: No  Exercise/Diet: Exercise/Diet Do You Exercise?: No Have You Gained or Lost A Significant Amount of Weight in the Past Six Months?: No Do You Follow a Special Diet?: No Do You Have Any Trouble Sleeping?:  (unknown)   CCA Employment/Education Employment/Work Situation: Employment / Work Situation Employment Situation: On disability Why is Patient on Disability: Mental Health How Long has Patient Been on Disability: Unknown Patient's Job has Been Impacted by Current Illness: No Has Patient ever Been in the [employer]?: No  Education: Education Did Theme park manager?: No Did You Have An Individualized Education Program (IIEP): No Did You Have Any Difficulty At School?: No Patient's Education Has Been Impacted by Current Illness: No   CCA Family/Childhood History Family and Relationship History: Family history Marital status: Single Does patient have children?: No  Childhood History:  Childhood History By whom was/is the patient raised?: Both parents Did patient suffer any verbal/emotional/physical/sexual abuse as a child?: No Did patient suffer from severe childhood neglect?: No Has patient ever been sexually abused/assaulted/raped as an adolescent or adult?: No Was the patient ever a victim of a crime or a  disaster?: No Witnessed domestic violence?: No Has patient been affected by domestic violence as an adult?: No  Child/Adolescent Assessment:     CCA Substance Use Alcohol/Drug Use: Alcohol / Drug Use Pain Medications: See PTA Prescriptions: See PTA Over the Counter: See PTA History of alcohol / drug use?: Yes Longest period of sobriety (when/how long): Unable to quantify Negative Consequences of Use: Personal relationships                         ASAM's:  Six Dimensions of Multidimensional Assessment  Dimension 1:  Acute Intoxication and/or Withdrawal Potential:      Dimension 2:  Biomedical Conditions and Complications:      Dimension 3:  Emotional, Behavioral, or Cognitive Conditions and Complications:     Dimension 4:  Readiness to  Change:     Dimension 5:  Relapse, Continued use, or Continued Problem Potential:     Dimension 6:  Recovery/Living Environment:     ASAM Severity Score:    ASAM Recommended Level of Treatment:     Substance use Disorder (SUD)    Recommendations for Services/Supports/Treatments:    DSM5 Diagnoses: Patient Active Problem List   Diagnosis Date Noted   Alcohol abuse 12/13/2022   Cannabis abuse 12/13/2022   Polycythemia, secondary 04/11/2019   Leukocytosis 04/11/2019   COPD (chronic obstructive pulmonary disease) (HCC) 04/10/2019   Depression with anxiety 04/10/2019   Fatty liver 04/10/2019   Hyperlipidemia 04/10/2019   Obstructive sleep apnea 04/10/2019   Obesity, morbid (HCC) 04/10/2019   Schizoaffective disorder, bipolar type (HCC) 04/10/2019   Other specified abnormal findings of blood chemistry 03/02/2014    Patient Centered Plan: Patient is on the following Treatment Plan(s):  Impulse Control and Substance Abuse   Referrals to Alternative Service(s): Referred to Alternative Service(s):   Place:   Date:   Time:    Referred to Alternative Service(s):   Place:   Date:   Time:    Referred to Alternative Service(s):    Place:   Date:   Time:    Referred to Alternative Service(s):   Place:   Date:   Time:      @BHCOLLABOFCARE @  [employer], LCAS-A

## 2022-12-13 NOTE — ED Notes (Signed)
Pt dressed out into behavioral scrubs by RN and EDT. Belongings include sandals, shirt, sweatshirt and pants. 1 labeled pt belonging bag.

## 2022-12-13 NOTE — ED Provider Notes (Addendum)
[facility] Provider Note    Event Date/Time   First MD Initiated Contact with Patient 12/13/22 [id number]     (approximate)   History   No chief complaint on file.   HPI  Jeffrey Velasquez is a 37 y.o. male past medical history of schizophrenia, who presents to the emergency department with PD for involuntary commitment paperwork for homicidal ideation.  Patient went out to drink alcohol last night, returned and had auditory and visual hallucinations.  Making threats of homicide to his mother.  Called 911 and the patient was found loading multiple guns and ammunition into his car.  Patient states that everyone is going to die, referring to people as scannable and states that everyone will be gone soon.     Physical Exam   Triage Vital Signs: ED Triage Vitals  Encounter Vitals Group     BP 12/13/22 0938 (!) 143/58     Systolic BP Percentile --      Diastolic BP Percentile --      Pulse Rate 12/13/22 0938 (!) 119     Resp 12/13/22 0938 16     Temp 12/13/22 0938 97.8 F (36.6 C)     Temp Source 12/13/22 0938 Oral     SpO2 12/13/22 0938 94 %     Weight 12/13/22 0937 (!) 318 lb 9 oz (144.5 kg)     Height --      Head Circumference --      Peak Flow --      Pain Score 12/13/22 0937 0     Pain Loc --      Pain Education --      Exclude from Growth Chart --     Most recent vital signs: Vitals:   12/13/22 1130 12/13/22 1156  BP: (!) 157/78   Pulse: 88 (!) 114  Resp: 18 20  Temp:    SpO2: 97% 95%    Physical Exam Constitutional:      Appearance: He is well-developed.     Comments: Surrounded by PD, in handcuffs behind his back  HENT:     Head: Atraumatic.  Eyes:     Conjunctiva/sclera: Conjunctivae normal.  Cardiovascular:     Rate and Rhythm: Regular rhythm.  Pulmonary:     Effort: No respiratory distress.  Musculoskeletal:     Cervical back: Normal range of motion.  Skin:    General: Skin is warm.  Neurological:     Mental Status:  He is alert. Mental status is at baseline.  Psychiatric:        Attention and Perception: He perceives auditory hallucinations.        Mood and Affect: Mood is anxious. Affect is angry.        Behavior: Behavior is agitated.        Thought Content: Thought content includes homicidal ideation. Thought content includes homicidal plan.        Judgment: Judgment is impulsive and inappropriate.     IMPRESSION / MDM / ASSESSMENT AND PLAN / ED COURSE  I reviewed the triage vital signs and the nursing notes.  Differential diagnosis including electrolyte abnormality, acute psychosis from schizophrenia, drug-induced psychosis  On arrival patient was agitated, unable to verbally de-escalate, making threats to staff members.  Concern for patient's safety and for provider safety.  Patient required IM medication with droperidol and Versed.  On evaluation does not have a history of prolonged QTc on last EKG.  Will obtain an EKG once  safe to do so.  Upheld IVC paperwork.  Consulted psychiatry.  EKG  I, Corena Herter, the attending physician, personally viewed and interpreted this ECG.  Normal sinus rhythm with a heart rate of 114.  Normal intervals, QTc 430.  No significant ST elevation or depression.  No signs of acute ischemia or dysrhythmia.    LABS (all labs ordered are listed, but only abnormal results are displayed) Labs interpreted as -    Labs Reviewed  COMPREHENSIVE METABOLIC PANEL - Abnormal; Notable for the following components:      Result Value   CO2 20 (*)    AST 53 (*)    ALT 57 (*)    All other components within normal limits  SALICYLATE LEVEL - Abnormal; Notable for the following components:   Salicylate Lvl <7.0 (*)    All other components within normal limits  ACETAMINOPHEN LEVEL - Abnormal; Notable for the following components:   Acetaminophen (Tylenol), Serum <10 (*)    All other components within normal limits  CBC - Abnormal; Notable for the following components:    WBC 13.1 (*)    Hemoglobin 17.9 (*)    All other components within normal limits  ETHANOL  URINE DRUG SCREEN, QUALITATIVE ([facility] ONLY)  CBC    MDM    Presented to the emergency department with PD with significant agitation, unable to redirect, concern for patient and staff safety.  Patient required chemical sedation with IM droperidol and Versed.  On reevaluation significant improvement of his agitation.  Resting comfortably.   Alcohol level is negative.  No significant electrolyte abnormality.   The patient has been placed in psychiatric observation due to the need to provide a safe environment for the patient while obtaining psychiatric consultation and evaluation, as well as ongoing medical and medication management to treat the patient's condition.  The patient has been placed under full IVC at this time.   PROCEDURES:  Critical Care performed: yes  .Critical Care  Performed by: Corena Herter, MD Authorized by: Corena Herter, MD   Critical care provider statement:    Critical care time (minutes):  45   Critical care time was exclusive of:  Separately billable procedures and treating other patients   Critical care was necessary to treat or prevent imminent or life-threatening deterioration of the following conditions:  Toxidrome   Critical care was time spent personally by me on the following activities:  Development of treatment plan with patient or surrogate, discussions with consultants, evaluation of patient's response to treatment, examination of patient, ordering and review of laboratory studies, ordering and review of radiographic studies, ordering and performing treatments and interventions, pulse oximetry, re-evaluation of patient's condition and review of old charts   I assumed direction of critical care for this patient from another provider in my specialty: no     Patient's presentation is most consistent with acute presentation with potential threat to life or  bodily function.   MEDICATIONS ORDERED IN ED: Medications  midazolam (VERSED) injection 4 mg (4 mg Intramuscular Given 12/13/22 1000)  droperidol (INAPSINE) 2.5 MG/ML injection 5 mg (5 mg Intramuscular Given 12/13/22 1000)    FINAL CLINICAL IMPRESSION(S) / ED DIAGNOSES   Final diagnoses:  Acute psychosis (HCC)  Homicidal ideation     Rx / DC Orders   ED Discharge Orders     None        Note:  This document was prepared using Dragon voice recognition software and may include unintentional dictation errors.  Corena Herter, MD 12/13/22 1036    Corena Herter, MD 12/13/22 1556

## 2022-12-13 NOTE — ED Notes (Signed)
Hospital meal provided.  100% consumed, pt tolerated w/o complaints.  Waste discarded appropriately.   

## 2022-12-13 NOTE — BH Assessment (Signed)
Writer attempted to assess patient. Patient given IM meds for agitation and aggressive behaviors towards hospital staff. Will try at later time.

## 2022-12-13 NOTE — ED Notes (Signed)
Pt given dinner tray and beverage  

## 2022-12-13 NOTE — ED Notes (Signed)
IVC/pending psych consult 

## 2022-12-14 ENCOUNTER — Encounter: Payer: Self-pay | Admitting: Oncology

## 2022-12-14 DIAGNOSIS — I428 Other cardiomyopathies: Secondary | ICD-10-CM | POA: Diagnosis not present

## 2022-12-14 DIAGNOSIS — I288 Other diseases of pulmonary vessels: Secondary | ICD-10-CM | POA: Diagnosis not present

## 2022-12-14 DIAGNOSIS — R112 Nausea with vomiting, unspecified: Secondary | ICD-10-CM | POA: Diagnosis not present

## 2022-12-14 DIAGNOSIS — I10 Essential (primary) hypertension: Secondary | ICD-10-CM | POA: Diagnosis not present

## 2022-12-14 NOTE — ED Notes (Signed)
EMTALA Reviewed by this RN.  

## 2022-12-14 NOTE — BH Assessment (Signed)
PATIENT BED AVAILABLE AFTER 10AM ON 12/14/22  Patient has been accepted to [facility].  Accepting physician is Dr. Sherrian Divers.  Call report to [phone].  Representative was [employer].   ER Staff is aware of it:  [facility] ER Secretary  Dr. Dolores Frame, ER MD  Dahlia Client Patient's Nurse

## 2022-12-14 NOTE — ED Provider Notes (Signed)
Emergency Medicine Observation Re-evaluation Note  Jeffrey Velasquez is a 37 y.o. male, seen on rounds today.  Pt initially presented to the ED for complaints of Schizophrenia Currently, the patient is resting, voices no medical complaints.  Physical Exam  BP 121/72   Pulse 78   Temp (!) 97.5 F (36.4 C) (Oral)   Resp 20   Wt (!) 144.5 kg   SpO2 100%   BMI 43.21 kg/m  Physical Exam General: Resting in no acute distress Cardiac: No cyanosis Lungs: Equal rise and fall Psych: Not agitated  ED Course / MDM  EKG:EKG Interpretation Date/Time:  Wednesday December 13 2022 11:00:03 EST Ventricular Rate:  114 PR Interval:  154 QRS Duration:  80 QT Interval:  312 QTC Calculation: 430 R Axis:   63  Text Interpretation: Sinus tachycardia Septal infarct , age undetermined Abnormal ECG When compared with ECG of 15-Apr-2020 12:28, PREVIOUS ECG IS PRESENT Confirmed by UNCONFIRMED, DOCTOR ([id number]), editor Lonell Face [phone]) on 12/13/2022 11:11:54 AM  I have reviewed the labs performed to date as well as medications administered while in observation.  Recent changes in the last 24 hours include no events overnight.  Plan  Current plan is for transfer to facility later this morning.    Irean Hong, MD 12/14/22 [phone]

## 2022-12-14 NOTE — BH Assessment (Signed)
Per [facility] AC Selena Batten), patient to be referred out of system.  Referral information for Psychiatric Hospitalization faxed to;   [facility] [phone]- [phone]) No available beds  [facility] [phone]- [phone]),   [facility] [phone]),  [address] [phone]),   [facility] [phone] -or- [phone]),   [facility] [phone])  [facility] [phone])

## 2022-12-14 NOTE — ED Provider Notes (Signed)
     Vitals:   12/13/22 2056 12/13/22 2156  BP: 121/72 121/72  Pulse: 78   Resp: 20   Temp: (!) 97.5 F (36.4 C)   SpO2: 100%      Informed patient of plan for him to be transferring to [facility].  He is understanding of the plan, he is not excited about it reporting that he feels like he could just go home, but he is under involuntary commitment in need of psychiatric hospitalization.  He is calm, but a bit abrasive in language.  Resquesting meal tray (pending)  He appears stable for transfer   Sharyn Creamer, MD 12/14/22 [phone]

## 2022-12-14 NOTE — ED Notes (Signed)
IVC/// Patient has been accepted to [facility].  Accepting physician is Dr. Sherrian Divers.  Call report to [phone].  Representative was [employer].

## 2022-12-14 NOTE — ED Notes (Signed)
Vitals attempted. Pt is not co operative. Pt very rude. Making threatening comments, violence toward staff.

## 2022-12-23 DIAGNOSIS — I10 Essential (primary) hypertension: Secondary | ICD-10-CM | POA: Diagnosis not present

## 2022-12-24 DIAGNOSIS — S0083XA Contusion of other part of head, initial encounter: Secondary | ICD-10-CM | POA: Diagnosis not present

## 2022-12-24 DIAGNOSIS — S0511XA Contusion of eyeball and orbital tissues, right eye, initial encounter: Secondary | ICD-10-CM | POA: Diagnosis not present

## 2022-12-25 DIAGNOSIS — G8911 Acute pain due to trauma: Secondary | ICD-10-CM | POA: Diagnosis not present

## 2022-12-25 DIAGNOSIS — Z712 Person consulting for explanation of examination or test findings: Secondary | ICD-10-CM | POA: Diagnosis not present

## 2022-12-25 DIAGNOSIS — S0002XA Blister (nonthermal) of scalp, initial encounter: Secondary | ICD-10-CM | POA: Diagnosis not present

## 2022-12-25 DIAGNOSIS — E559 Vitamin D deficiency, unspecified: Secondary | ICD-10-CM | POA: Diagnosis not present

## 2022-12-25 DIAGNOSIS — R799 Abnormal finding of blood chemistry, unspecified: Secondary | ICD-10-CM | POA: Diagnosis not present

## 2022-12-27 NOTE — Progress Notes (Signed)
Evaluate for reasons for acute psychosis, possible substance induced

## 2023-01-01 ENCOUNTER — Other Ambulatory Visit: Payer: Self-pay

## 2023-01-01 ENCOUNTER — Emergency Department
Admission: EM | Admit: 2023-01-01 | Discharge: 2023-01-05 | Disposition: A | Discharge status: Other Acute Inpatient Hospital | Payer: 59 | Attending: Student in an Organized Health Care Education/Training Program | Admitting: Student in an Organized Health Care Education/Training Program

## 2023-01-01 DIAGNOSIS — F1210 Cannabis abuse, uncomplicated: Secondary | ICD-10-CM | POA: Diagnosis not present

## 2023-01-01 DIAGNOSIS — R456 Violent behavior: Secondary | ICD-10-CM | POA: Diagnosis present

## 2023-01-01 DIAGNOSIS — Z79899 Other long term (current) drug therapy: Secondary | ICD-10-CM | POA: Insufficient documentation

## 2023-01-01 DIAGNOSIS — F250 Schizoaffective disorder, bipolar type: Secondary | ICD-10-CM | POA: Diagnosis not present

## 2023-01-01 DIAGNOSIS — F1090 Alcohol use, unspecified, uncomplicated: Secondary | ICD-10-CM | POA: Diagnosis not present

## 2023-01-01 DIAGNOSIS — R451 Restlessness and agitation: Secondary | ICD-10-CM

## 2023-01-01 DIAGNOSIS — F1010 Alcohol abuse, uncomplicated: Secondary | ICD-10-CM | POA: Insufficient documentation

## 2023-01-01 LAB — URINE DRUG SCREEN, QUALITATIVE (ARMC ONLY)
Amphetamines, Ur Screen: NOT DETECTED
Barbiturates, Ur Screen: NOT DETECTED
Benzodiazepine, Ur Scrn: NOT DETECTED
Cannabinoid 50 Ng, Ur ~~LOC~~: POSITIVE — AB
Cocaine Metabolite,Ur ~~LOC~~: NOT DETECTED
MDMA (Ecstasy)Ur Screen: NOT DETECTED
Methadone Scn, Ur: NOT DETECTED
Opiate, Ur Screen: NOT DETECTED
Phencyclidine (PCP) Ur S: NOT DETECTED
Tricyclic, Ur Screen: NOT DETECTED

## 2023-01-01 LAB — COMPREHENSIVE METABOLIC PANEL
ALT: 76 U/L — ABNORMAL HIGH (ref 0–44)
AST: 49 U/L — ABNORMAL HIGH (ref 15–41)
Albumin: 4.4 g/dL (ref 3.5–5.0)
Alkaline Phosphatase: 67 U/L (ref 38–126)
Anion gap: 13 (ref 5–15)
BUN: 12 mg/dL (ref 6–20)
CO2: 22 mmol/L (ref 22–32)
Calcium: 10 mg/dL (ref 8.9–10.3)
Chloride: 100 mmol/L (ref 98–111)
Creatinine, Ser: 0.78 mg/dL (ref 0.61–1.24)
GFR, Estimated: >60 mL/min (ref >60)
Glucose, Bld: 109 mg/dL — ABNORMAL HIGH (ref 70–99)
Potassium: 4.1 mmol/L (ref 3.5–5.1)
Sodium: 135 mmol/L (ref 135–145)
Total Bilirubin: 0.7 mg/dL (ref <1.2)
Total Protein: 7.8 g/dL (ref 6.5–8.1)

## 2023-01-01 LAB — CBC
HCT: 50.5 % (ref 39.0–52.0)
Hemoglobin: 17.3 g/dL — ABNORMAL HIGH (ref 13.0–17.0)
MCH: 30.1 pg (ref 26.0–34.0)
MCHC: 34.3 g/dL (ref 30.0–36.0)
MCV: 88 fL (ref 80.0–100.0)
Platelets: 209 10*3/uL (ref 150–400)
RBC: 5.74 MIL/uL (ref 4.22–5.81)
RDW: 12.1 % (ref 11.5–15.5)
WBC: 14.3 10*3/uL — ABNORMAL HIGH (ref 4.0–10.5)
nRBC: 0 % (ref 0.0–0.2)

## 2023-01-01 LAB — SALICYLATE LEVEL: Salicylate Lvl: <7 mg/dL — ABNORMAL LOW (ref 7.0–30.0)

## 2023-01-01 LAB — ACETAMINOPHEN LEVEL: Acetaminophen (Tylenol), Serum: <10 ug/mL — ABNORMAL LOW (ref 10–30)

## 2023-01-01 LAB — ETHANOL: Alcohol, Ethyl (B): <10 mg/dL (ref <10)

## 2023-01-01 NOTE — ED Notes (Addendum)
Report off to kim rn bhu nurse  pt to bhu with er tech and security via wheelchair.

## 2023-01-01 NOTE — ED Notes (Signed)
IVC PENDING  CONSULT ?

## 2023-01-01 NOTE — ED Notes (Signed)
This NT provided pt with pm snack. 

## 2023-01-01 NOTE — ED Triage Notes (Signed)
Pt to ED with [employer] under IVC. IVC was taken out by mother after pt got aggressive. Pt reports "another fight with my mom." Pt reports was at [employer] and got verbally aggressive with bystander. Pt denies SI/HI.   IVC papers states he was having bizarre behavior and became aggressive.   Pt reports has been med compliant. Pt denies drug use. Pt reports about a beer an hour.
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
## 2023-01-01 NOTE — ED Notes (Signed)
Pt. To BHU from ED ambulatory without difficulty, to room  BHU 5. Report from Amy RN. Pt. Is alert and oriented, warm and dry in no distress. Pt. Denies SI, HI, and AVH. Pt. Calm and cooperative. Pt. Made aware of security cameras and Q15 minute rounds. Pt. Encouraged to let Nursing staff know of any concerns or needs.   ENVIRONMENTAL ASSESSMENT Potentially harmful objects out of patient reach: Yes.   Personal belongings secured: Yes.   Patient dressed in hospital provided attire only: Yes.   Plastic bags out of patient reach: Yes.   Patient care equipment (cords, cables, call bells, lines, and drains) shortened, removed, or accounted for: Yes.   Equipment and supplies removed from bottom of stretcher: Yes.   Potentially toxic materials out of patient reach: Yes.   Sharps container removed or out of patient reach: Yes.

## 2023-01-01 NOTE — ED Notes (Signed)
ivc moved to bhu 5.

## 2023-01-01 NOTE — ED Provider Notes (Signed)
[facility] Provider Note    Event Date/Time   First MD Initiated Contact with Patient 01/01/23 1652     (approximate)   History   IVC    HPI  Jeffrey Velasquez is a 37 y.o. male presents ER under IVC with paperwork taken out by mother reporting agitated and aggressive behavior at home.  [facility] provider came and checked on the patient and she was recommending recommitment and concern for the family safety.  Patient states that he feels better since he was admitted.  States he is drinking 1 beer per hour.  Does not feel angry or agitated at this time.  Denies any SI or HI.     Physical Exam   Triage Vital Signs: ED Triage Vitals  Encounter Vitals Group     BP 01/01/23 1624 (!) 133/109     Systolic BP Percentile --      Diastolic BP Percentile --      Pulse Rate 01/01/23 1624 100     Resp 01/01/23 1624 20     Temp 01/01/23 1624 97.9 F (36.6 C)     Temp Source 01/01/23 1624 Oral     SpO2 01/01/23 1624 98 %     Weight --      Height --      Head Circumference --      Peak Flow --      Pain Score 01/01/23 1621 0     Pain Loc --      Pain Education --      Exclude from Growth Chart --     Most recent vital signs: Vitals:   01/01/23 1624  BP: (!) 133/109  Pulse: 100  Resp: 20  Temp: 97.9 F (36.6 C)  SpO2: 98%     Constitutional: Alert  Eyes: Conjunctivae are normal.  Head: Atraumatic. Nose: No congestion/rhinnorhea. Mouth/Throat: Mucous membranes are moist.   Neck: Painless ROM.  Cardiovascular:   Good peripheral circulation. Respiratory: Normal respiratory effort.  No retractions.  Gastrointestinal: Soft and nontender.  Musculoskeletal:  no deformity Neurologic:  MAE spontaneously. No gross focal neurologic deficits are appreciated.  Skin:  Skin is warm, dry and intact. No rash noted. Psychiatric: Mood and affect are normal. Speech and behavior are normal.    ED Results / Procedures / Treatments   Labs (all labs  ordered are listed, but only abnormal results are displayed) Labs Reviewed  COMPREHENSIVE METABOLIC PANEL - Abnormal; Notable for the following components:      Result Value   Glucose, Bld 109 (*)    AST 49 (*)    ALT 76 (*)    All other components within normal limits  CBC - Abnormal; Notable for the following components:   WBC 14.3 (*)    Hemoglobin 17.3 (*)    All other components within normal limits  ETHANOL  SALICYLATE LEVEL  ACETAMINOPHEN LEVEL  URINE DRUG SCREEN, QUALITATIVE ([facility] ONLY)     EKG     RADIOLOGY    PROCEDURES:  Critical Care performed:   Procedures   MEDICATIONS ORDERED IN ED: Medications - No data to display   IMPRESSION / MDM / ASSESSMENT AND PLAN / ED COURSE  I reviewed the triage vital signs and the nursing notes.                              Differential diagnosis includes, but is not limited to,  Psychosis, delirium, medication effect, noncompliance, polysubstance abuse, Si, Hi, depression   Patient has psych history of schizophrenia and substance abuse.  Laboratory testing was ordered to evaluation for underlying electrolyte derangement or signs of underlying organic pathology to explain today's presentation.  Based on history and physical and laboratory evaluation, it appears that the patient's presentation is 2/2 underlying psychiatric disorder and will require further evaluation and management by inpatient psychiatry.  Patient was made an IVC pta, will continue.  Disposition pending psychiatric evaluation.        FINAL CLINICAL IMPRESSION(S) / ED DIAGNOSES   Final diagnoses:  Agitation     Rx / DC Orders   ED Discharge Orders     None        Note:  This document was prepared using Dragon voice recognition software and may include unintentional dictation errors.    Willy Eddy, MD 01/01/23 [phone]

## 2023-01-02 DIAGNOSIS — F250 Schizoaffective disorder, bipolar type: Secondary | ICD-10-CM

## 2023-01-02 DIAGNOSIS — F1090 Alcohol use, unspecified, uncomplicated: Secondary | ICD-10-CM

## 2023-01-02 MED ORDER — CLONIDINE HCL 0.1 MG PO TABS
0.1000 mg | ORAL_TABLET | Freq: Two times a day (BID) | ORAL | Status: DC
  Administered 2023-01-02 – 2023-01-04 (×6): 0.1 mg via ORAL
  Filled 2023-01-02 (×6): qty 1

## 2023-01-02 MED ORDER — FLUOXETINE HCL 20 MG PO CAPS
60.0000 mg | ORAL_CAPSULE | Freq: Every morning | ORAL | Status: DC
  Administered 2023-01-02 – 2023-01-04 (×3): 60 mg via ORAL
  Filled 2023-01-02 (×3): qty 3

## 2023-01-02 MED ORDER — BUPROPION HCL ER (XL) 150 MG PO TB24
300.0000 mg | ORAL_TABLET | Freq: Every day | ORAL | Status: DC
  Administered 2023-01-02 – 2023-01-04 (×3): 300 mg via ORAL
  Filled 2023-01-02 (×3): qty 2

## 2023-01-02 MED ORDER — LORAZEPAM 2 MG PO TABS: 0.0000 mg | ORAL_TABLET | Freq: Two times a day (BID) | ORAL | Status: DC

## 2023-01-02 MED ORDER — ATORVASTATIN CALCIUM 20 MG PO TABS
40.0000 mg | ORAL_TABLET | Freq: Every day | ORAL | Status: DC
  Administered 2023-01-02 – 2023-01-04 (×3): 40 mg via ORAL
  Filled 2023-01-02 (×3): qty 2

## 2023-01-02 MED ORDER — METFORMIN HCL 500 MG PO TABS
500.0000 mg | ORAL_TABLET | Freq: Every day | ORAL | Status: DC
  Administered 2023-01-02 – 2023-01-04 (×3): 500 mg via ORAL
  Filled 2023-01-02 (×3): qty 1

## 2023-01-02 MED ORDER — LISINOPRIL 5 MG PO TABS
10.0000 mg | ORAL_TABLET | Freq: Every day | ORAL | Status: DC
  Administered 2023-01-02 – 2023-01-04 (×3): 10 mg via ORAL
  Filled 2023-01-02 (×3): qty 2

## 2023-01-02 MED ORDER — OLANZAPINE 10 MG PO TBDP
10.0000 mg | ORAL_TABLET | Freq: Three times a day (TID) | ORAL | Status: DC | PRN
  Administered 2023-01-04 (×2): 10 mg via ORAL
  Filled 2023-01-02: qty 1

## 2023-01-02 MED ORDER — LORAZEPAM 2 MG/ML IJ SOLN: 0.0000 mg | Freq: Two times a day (BID) | INTRAMUSCULAR | Status: DC

## 2023-01-02 MED ORDER — ARIPIPRAZOLE 5 MG PO TABS
15.0000 mg | ORAL_TABLET | Freq: Every day | ORAL | Status: DC
  Administered 2023-01-02 – 2023-01-04 (×3): 15 mg via ORAL
  Filled 2023-01-02 (×3): qty 1

## 2023-01-02 MED ORDER — THIAMINE HCL 100 MG/ML IJ SOLN
100.0000 mg | Freq: Every day | INTRAMUSCULAR | Status: DC
  Filled 2023-01-02 (×4): qty 1

## 2023-01-02 MED ORDER — BENZTROPINE MESYLATE 1 MG PO TABS
1.0000 mg | ORAL_TABLET | Freq: Two times a day (BID) | ORAL | Status: DC
  Administered 2023-01-02 – 2023-01-04 (×6): 1 mg via ORAL
  Filled 2023-01-02 (×6): qty 1

## 2023-01-02 MED ORDER — PROPRANOLOL HCL 10 MG PO TABS
10.0000 mg | ORAL_TABLET | Freq: Two times a day (BID) | ORAL | Status: DC
  Administered 2023-01-02 – 2023-01-04 (×5): 10 mg via ORAL
  Filled 2023-01-02 (×8): qty 1

## 2023-01-02 MED ORDER — LORAZEPAM 2 MG/ML IJ SOLN: 0.0000 mg | Freq: Four times a day (QID) | INTRAMUSCULAR | Status: DC

## 2023-01-02 MED ORDER — OLANZAPINE 10 MG IM SOLR: 10.0000 mg | Freq: Once | INTRAMUSCULAR | Status: DC | PRN

## 2023-01-02 MED ORDER — ACETAMINOPHEN 500 MG PO TABS
1000.0000 mg | ORAL_TABLET | Freq: Once | ORAL | Status: AC
Start: 2023-01-02 — End: 2023-01-02
  Administered 2023-01-02: 1000 mg via ORAL
  Filled 2023-01-02: qty 2

## 2023-01-02 MED ORDER — LORAZEPAM 2 MG PO TABS
0.0000 mg | ORAL_TABLET | Freq: Four times a day (QID) | ORAL | Status: DC
  Administered 2023-01-03: 1 mg via ORAL
  Filled 2023-01-02: qty 1

## 2023-01-02 MED ORDER — RISPERIDONE 1 MG PO TABS
2.0000 mg | ORAL_TABLET | Freq: Every day | ORAL | Status: DC
  Administered 2023-01-02 – 2023-01-04 (×3): 2 mg via ORAL
  Filled 2023-01-02 (×3): qty 2

## 2023-01-02 MED ORDER — PANTOPRAZOLE SODIUM 40 MG PO TBEC
40.0000 mg | DELAYED_RELEASE_TABLET | Freq: Two times a day (BID) | ORAL | Status: DC
  Administered 2023-01-02 – 2023-01-04 (×6): 40 mg via ORAL
  Filled 2023-01-02 (×6): qty 1

## 2023-01-02 MED ORDER — HYDROXYZINE HCL 25 MG PO TABS: 50.0000 mg | ORAL_TABLET | Freq: Three times a day (TID) | ORAL | Status: DC | PRN

## 2023-01-02 MED ORDER — THIAMINE MONONITRATE 100 MG PO TABS
100.0000 mg | ORAL_TABLET | Freq: Every day | ORAL | Status: DC
  Administered 2023-01-02 – 2023-01-04 (×3): 100 mg via ORAL
  Filled 2023-01-02 (×3): qty 1

## 2023-01-02 NOTE — ED Notes (Signed)
IVC/Pending Placement 

## 2023-01-02 NOTE — ED Notes (Addendum)
Pt. Alert and oriented, warm and dry, in no distress. Pt. Denies SI, HI, and AVH. Pt requesting some coloring pages. Writer gave patient a wod search and crossword puzzles.  Pt. Encouraged to let nursing staff know of any concerns or needs.  ENVIRONMENTAL ASSESSMENT Potentially harmful objects out of patient reach: Yes.   Personal belongings secured: Yes.   Patient dressed in hospital provided attire only: Yes.   Plastic bags out of patient reach: Yes.   Patient care equipment (cords, cables, call bells, lines, and drains) shortened, removed, or accounted for: Yes.   Equipment and supplies removed from bottom of stretcher: Yes.   Potentially toxic materials out of patient reach: Yes.   Sharps container removed or out of patient reach: Yes.

## 2023-01-02 NOTE — ED Notes (Signed)
Allowed patient to use phone for 10 minute period as long as he was going to stop being aggressive and be respectful towards staff. After patient got off phone seemed to be less irritated and hostile and stated he was going to go take a nap at this time.

## 2023-01-02 NOTE — ED Provider Notes (Signed)
Emergency Medicine Observation Re-evaluation Note  Jeffrey Velasquez is a 37 y.o. male, seen on rounds today.  Pt initially presented to the ED for complaints of IVC   Currently, the patient is calm, no acute complaints.  Physical Exam  Blood pressure (!) 132/100, pulse 94, temperature 97.6 F (36.4 C), temperature source Oral, resp. rate 16, SpO2 96%. Physical Exam General: NAD Lungs: CTAB Psych: not agitated  ED Course / MDM  EKG:    I have reviewed the labs performed to date as well as medications administered while in observation.  Recent changes in the last 24 hours include no acute events overnight.    Plan  Current plan is for psych eval. Patient is under full IVC at this time.   Sharman Cheek, MD 01/02/23 [phone]

## 2023-01-02 NOTE — ED Notes (Signed)
Telepsych in process 

## 2023-01-02 NOTE — BH Assessment (Signed)
Adult MH  Referral information for Psychiatric Hospitalization faxed to:   [facility] ([phone]-or- [phone]),   [facility] ([phone]),   [facility] ([phone] -or- [phone]),   [place] ([phone]---[phone]---[phone]),   [place] [facility] ([phone] or [phone])   [facility] ([phone] or [phone]),   [facility] ([phone]) 

## 2023-01-02 NOTE — Consult Note (Signed)
[facility] Consult Note  Patient Name: Jeffrey Velasquez MRN: [id number] DOB: 1985/11/26 DATE OF Consult: 01/02/2023  PRIMARY PSYCHIATRIC DIAGNOSES  1.  Schizoaffective disorder, bipolar type 2.  Alcohol use disorder 3.  Rule out substance induced mood disorder/psychosis    RECOMMENDATIONS  Recommendations: Medication recommendations: Please reconcile and continue patient's home medications. Recommend olanzapine 10mg  po TID PRN agitation; recommend hydroxyzine 50mg  po TID PRN anxiety; Recommend CIWA for alcohol withdrawal  Non-Medication/therapeutic recommendations: Psychiatric hospitalization  Is inpatient psychiatric hospitalization recommended for this patient? Yes (Explain why): Patient grossly disorganized, paranoid, delusional, reportedly aggressive at home Follow-Up Telepsychiatry C/L services: We will continue to follow this patient with you until stabilized or discharged.  If you have any questions or concerns, please call our TeleCare Coordination service at  [phone] and ask for myself or the provider on-call. Communication: Treatment team members (and family members if applicable) who were involved in treatment/care discussions and planning, and with whom we spoke or engaged with via secure text/chat, include the following: Dr. Elesa Massed; Cala Bradford  ASSESSMENT Jeffrey Velasquez is a 37 year old male with a history of schizoaffective disorder, bipolar type and alcohol use disorder who presents to the ED on an IVC for aggressive behavior at home. Per IVC, patient responding to internal stimuli, cooking breakfast at 4:30 AM, aggressive towards his mom and psychiatrist, and psychiatrist was concerned about the safety of patient's parents. UDS positive for cannabis, ethanol negative. Psychiatry consulted for disposition recommendations. On evaluation, patient noted to be hyperverbal with pressured speech, expansive, tangential, paranoid, religiously preoccupied, not appearing  internally preoccupied, not responding to internal stimuli, alert and oriented x 4. Patient reports belief that the employees at [employer] were speaking ill of him so he got angry and went home angry. Reports he was upset with his psychiatrist who came to the house. Per IVC, patient was aggressive, which he denies. Patient rambling and religiously preoccupied with extensive delusional system. The patient's presentation is consistent with schizoaffective disorder, bipolar type; alcohol use disorder; rule out substance induced mood disorder/psychosis. Patient denies suicidal and homicidal ideation. However, patient at high risk for harm to self and others due to delusional thought content, including significant paranoia, aggression, impulsivity, and disorganization. Therefore, inpatient psychiatric hospitalization is recommended.    Thank you for involving [place] in the care of this patient. If you have any additional questions or concerns, please call [phone] and ask for me or the provider on-call.  TELEPSYCHIATRY ATTESTATION & CONSENT  As the provider for this telehealth consult, I attest that I verified the patient's identity using two separate identifiers, introduced myself to the patient, provided my credentials, disclosed my location, and performed this encounter via a HIPAA-compliant, real-time, face-to-face, two-way, interactive audio and video platform and with the full consent and agreement of the patient (or guardian as applicable.)  Patient physical location: ED in [facility]  Telehealth provider physical location: home office in state of [place]   Video start time: 0246 AM EST Video end time: 0305 AM EST   IDENTIFYING DATA  Jeffrey Velasquez is a 37 y.o. year-old male for whom a psychiatric consultation has been ordered by the primary provider. The patient was identified using two separate identifiers.  CHIEF COMPLAINT/REASON FOR CONSULT  Aggression and psychosis     HISTORY OF PRESENT ILLNESS (HPI)  Jeffrey Velasquez is a 37 year old male with a history of schizoaffective disorder, bipolar type and alcohol use disorder who presents to the ED on an IVC for  aggressive behavior at home. Per IVC, patient responding to internal stimuli, cooking breakfast at 4:30 AM, aggressive towards his mom and psychiatrist, and psychiatrist was concerned about the safety of patient's parents. UDS positive for cannabis, ethanol negative. Psychiatry consulted for disposition recommendations.   On evaluation, patient noted to be hyperverbal with pressured speech, expansive, tangential, paranoid, religiously preoccupied, not appearing internally preoccupied, not responding to internal stimuli, alert and oriented x 4. Patient reports he is in the ED "because people put me here because they want power." He states "my parents want power over me." Patient reports he got into a disagreement with his parents today. States he went to the [employer] because he wanted to make hot dogs and they didn't have buns. States he drove there. And reports that "two women like they do, they gossip, they insulted me at the counter and made little remarks calling me "chicken." He reports he got mad. He states, "I'm tired of people letting me down and bullying me." States he got back home and his psychiatrist showed up to see him. He states he was angry. And states he told her he was reading in the bible in the hospital. "And I told her to leave." He states his psychiatrist called his mom "and they conspired to get me into the hospital again." Patient states, "my meds are working perfectly fine." Patient reports his mom is a Simonne Come "and she likes power." Patient reports the police are "keeping an eye on me" and reports his psychiatrist is in contact with the police. Patient states, "The world is ending and we need to prepare. The antichrist is coming. I see the signs. White people are angels. When they have blue  eyes they have God's power. They are supposed to protect the Jewish people.They have masks that push the eyes out on the Germans." Patient goes on to say, "When people commit sins, they power the sun." Patient states, "Witches are in the government and Trump is trying to kick them out. And I believe in the Illuminati." Patient denies auditory hallucinations and visual hallucinations. Denies suicidal and homicidal ideation. Reports he is taking: Depakote, Abilify, Buspar, Prozac.Reports he drinks alcohol daily, does not know amount. Denies a history of complicated withdrawal seizure, delirium tremens. Patient reports he has been living with his parents.    PAST PSYCHIATRIC HISTORY  Outpatient: Has a psychiatrist  Inpatient: Multiple, recently discharged from psychiatric hospitalization  Non-suicidal self injury: Denies  Suicide attempts: Violence: History of aggression  Drugs/alcohol: Reports he drinks alcohol daily, does not know amount Otherwise as per HPI above.  PAST MEDICAL HISTORY  Past Medical History:  Diagnosis Date   Anxiety    Depression    Hyperlipidemia    Polysubstance abuse (HCC) 09/05/2022   Schizophrenia (HCC)      HOME MEDICATIONS  PTA Medications  Medication Sig   ABILIFY MAINTENA 400 MG PRSY prefilled syringe Inject 400 mg into the muscle every 30 (thirty) days.   propranolol (INDERAL) 10 MG tablet Take 10 mg by mouth 2 (two) times daily.   FLUoxetine (PROZAC) 20 MG capsule Take 60 mg by mouth every morning.   atorvastatin (LIPITOR) 40 MG tablet Take 1 tablet (40 mg total) by mouth daily.   lisinopril (ZESTRIL) 5 MG tablet TAKE 1 TABLET BY MOUTH DAILY FOR HYPERTENSION   sildenafil (VIAGRA) 50 MG tablet Take 1 tablet (50 mg total) by mouth daily as needed for erectile dysfunction.   pantoprazole (PROTONIX) 40 MG tablet Take 40  mg by mouth 2 (two) times daily.   metFORMIN (GLUCOPHAGE) 500 MG tablet Take 500 mg by mouth daily.   cloNIDine (CATAPRES) 0.1 MG tablet Take  0.1 mg by mouth 2 (two) times daily.   buPROPion (WELLBUTRIN XL) 300 MG 24 hr tablet Take 300 mg by mouth daily.   benztropine (COGENTIN) 1 MG tablet Take 1 mg by mouth 2 (two) times daily.   divalproex (DEPAKOTE ER) 500 MG 24 hr tablet Take 1,000 mg by mouth in the morning and at bedtime.   risperiDONE (RISPERDAL) 2 MG tablet Take 2 mg by mouth at bedtime.   ARIPiprazole (ABILIFY) 15 MG tablet Take 15 mg by mouth daily.   BREZTRI AEROSPHERE 160-9-4.8 MCG/ACT AERO Inhale 2 puffs into the lungs in the morning and at bedtime. (Patient not taking: Reported on 01/01/2023)   ARIPiprazole (ABILIFY) 5 MG tablet Take 5 mg by mouth daily. (Patient not taking: Reported on 01/01/2023)   OLANZapine (ZYPREXA) 10 MG tablet Take 10 mg by mouth at bedtime. (Patient not taking: Reported on 01/01/2023)   ARIPiprazole (ABILIFY) 10 MG tablet Take 10 mg by mouth daily. (Patient not taking: Reported on 01/01/2023)     ALLERGIES  Allergies  Allergen Reactions   Clonazepam    Shellfish Allergy     SOCIAL & SUBSTANCE USE HISTORY  Social History   Socioeconomic History   Marital status: Single    Spouse name: Not on file   Number of children: Not on file   Years of education: Not on file   Highest education level: Not on file  Occupational History   Not on file  Tobacco Use   Smoking status: Every Day    Current packs/day: 2.00    Types: Cigarettes   Smokeless tobacco: Never  Vaping Use   Vaping status: Never Used  Substance and Sexual Activity   Alcohol use: Yes   Drug use: Yes    Types: Marijuana    Comment: Daily   Sexual activity: Not on file  Other Topics Concern   Not on file  Social History Narrative   Not on file   Social Drivers of Health   Financial Resource Strain: Low Risk  (10/31/2022)   Received from [facility]   Overall Financial Resource Strain (CARDIA)    Difficulty of Paying Living Expenses: Not hard at all  Food Insecurity: No Food Insecurity  (10/31/2022)   Received from [facility]   Hunger Vital Sign    Worried About Running Out of Food in the Last Year: Never true    Ran Out of Food in the Last Year: Never true  Transportation Needs: No Transportation Needs (10/31/2022)   Received from [facility] - Transportation    In the past 12 months, has lack of transportation kept you from medical appointments or from getting medications?: No    Lack of Transportation (Non-Medical): No  Physical Activity: Not on file  Stress: Not on file  Social Connections: Not on file   Social History   Tobacco Use  Smoking Status Every Day   Current packs/day: 2.00   Types: Cigarettes  Smokeless Tobacco Never   Social History   Substance and Sexual Activity  Alcohol Use Yes   Social History   Substance and Sexual Activity  Drug Use Yes   Types: Marijuana   Comment: Daily    Additional pertinent information Reports he drinks alcohol daily about 2 40s per night  FAMILY HISTORY  History reviewed. No pertinent family history.   MENTAL STATUS EXAM (MSE)  Mental Status Exam: General Appearance: Neat  Orientation:  Full (Time, Place, and Person)  Memory:  Immediate;   Fair Recent;   Fair  Concentration:  Concentration: Fair  Recall:  Fair  Attention  Fair  Eye Contact:  Good  Speech:  Pressured  Language:  Good  Volume:  Normal  Mood: "Fine"  Affect:  Labile  Thought Process:  Disorganized  Thought Content:  Paranoid Ideation  Suicidal Thoughts:  No  Homicidal Thoughts:  No  Judgement:  Poor  Insight:   Poor  Psychomotor Activity:  Normal  Akathisia:  Negative  Fund of Knowledge:  Good    Assets:  Communication Skills  Cognition:  WNL  ADL's:  Intact  AIMS (if indicated):       VITALS  Blood pressure 137/76, pulse 100, temperature 98.6 F (37 C), temperature source Oral, resp. rate 19, SpO2 94%.  LABS  Admission on 01/01/2023  Component Date Value Ref Range  Status   Sodium 01/01/2023 135  135 - 145 mmol/L Final   Potassium 01/01/2023 4.1  3.5 - 5.1 mmol/L Final   Chloride 01/01/2023 100  98 - 111 mmol/L Final   CO2 01/01/2023 22  22 - 32 mmol/L Final   Glucose, Bld 01/01/2023 109 (H)  70 - 99 mg/dL Final   Glucose reference range applies only to samples taken after fasting for at least 8 hours.   BUN 01/01/2023 12  6 - 20 mg/dL Final   Creatinine, Ser 01/01/2023 0.78  0.61 - 1.24 mg/dL Final   Calcium 16/10/9602 10.0  8.9 - 10.3 mg/dL Final   Total Protein 54/09/8117 7.8  6.5 - 8.1 g/dL Final   Albumin 14/78/2956 4.4  3.5 - 5.0 g/dL Final   AST 21/30/8657 49 (H)  15 - 41 U/L Final   ALT 01/01/2023 76 (H)  0 - 44 U/L Final   Alkaline Phosphatase 01/01/2023 67  38 - 126 U/L Final   Total Bilirubin 01/01/2023 0.7  <1.2 mg/dL Final   GFR, Estimated 01/01/2023 >60  >60 mL/min Final   Comment: (NOTE) Calculated using the CKD-EPI Creatinine Equation (2021)    Anion gap 01/01/2023 13  5 - 15 Final   Performed at [facility], [address] [place], [place] [address]   Alcohol, Ethyl (B) 01/01/2023 <10  <10 mg/dL Final   Comment: (NOTE) Lowest detectable limit for serum alcohol is 10 mg/dL.  For medical purposes only. Performed at [facility], [address] [place], [place] [address]    Salicylate Lvl 01/01/2023 <7.0 (L)  7.0 - 30.0 mg/dL Final   Performed at [facility], [address] [place], [place] [address]   Acetaminophen (Tylenol), Serum 01/01/2023 <10 (L)  10 - 30 ug/mL Final   Comment: (NOTE) Therapeutic concentrations vary significantly. A range of 10-30 ug/mL  may be an effective concentration for many patients. However, some  are best treated at concentrations outside of this range. Acetaminophen concentrations >150 ug/mL at 4 hours after ingestion  and >50 ug/mL at 12 hours after ingestion are often associated with  toxic reactions.  Performed at [facility], [address] [place], [place] [address]    WBC 01/01/2023 14.3 (H)  4.0 - 10.5 K/uL Final   RBC 01/01/2023 5.74  4.22 - 5.81 MIL/uL Final   Hemoglobin 01/01/2023 17.3 (H)  13.0 - 17.0 g/dL Final  HCT 01/01/2023 50.5  39.0 - 52.0 % Final   MCV 01/01/2023 88.0  80.0 - 100.0 fL Final   MCH 01/01/2023 30.1  26.0 - 34.0 pg Final   MCHC 01/01/2023 34.3  30.0 - 36.0 g/dL Final   RDW 64/40/3474 12.1  11.5 - 15.5 % Final   Platelets 01/01/2023 209  150 - 400 K/uL Final   nRBC 01/01/2023 0.0  0.0 - 0.2 % Final   Performed at [facility], [address] [place], [place] [address]   Tricyclic, Ur Screen 01/01/2023 NONE DETECTED  NONE DETECTED Final   Amphetamines, Ur Screen 01/01/2023 NONE DETECTED  NONE DETECTED Final   MDMA (Ecstasy)Ur Screen 01/01/2023 NONE DETECTED  NONE DETECTED Final   Cocaine Metabolite,Ur [place] 01/01/2023 NONE DETECTED  NONE DETECTED Final   Opiate, Ur Screen 01/01/2023 NONE DETECTED  NONE DETECTED Final   Phencyclidine (PCP) Ur S 01/01/2023 NONE DETECTED  NONE DETECTED Final   Cannabinoid 50 Ng, Ur [place] 01/01/2023 POSITIVE (A)  NONE DETECTED Final   Barbiturates, Ur Screen 01/01/2023 NONE DETECTED  NONE DETECTED Final   Benzodiazepine, Ur Scrn 01/01/2023 NONE DETECTED  NONE DETECTED Final   Methadone Scn, Ur 01/01/2023 NONE DETECTED  NONE DETECTED Final   Comment: (NOTE) Tricyclics + metabolites, urine    Cutoff 1000 ng/mL Amphetamines + metabolites, urine  Cutoff 1000 ng/mL MDMA (Ecstasy), urine              Cutoff 500 ng/mL Cocaine Metabolite, urine          Cutoff 300 ng/mL Opiate + metabolites, urine        Cutoff 300 ng/mL Phencyclidine (PCP), urine         Cutoff 25 ng/mL Cannabinoid, urine                 Cutoff 50 ng/mL Barbiturates + metabolites, urine  Cutoff 200 ng/mL Benzodiazepine, urine              Cutoff 200 ng/mL Methadone, urine                   Cutoff 300 ng/mL  The urine drug screen provides only a preliminary, unconfirmed analytical test result and  should not be used for non-medical purposes. Clinical consideration and professional judgment should be applied to any positive drug screen result due to possible interfering substances. A more specific alternate chemical method must be used in order to obtain a confirmed analytical result. Gas chromatography / mass spectrometry (GC/MS) is the preferred confirm                          atory method. Performed at [facility], [address] [place], [place] [address]     PSYCHIATRIC REVIEW OF SYSTEMS (ROS)  ROS: Notable for the following relevant positive findings: Review of Systems  Psychiatric/Behavioral:  Positive for substance abuse.     Additional findings:      Musculoskeletal: No abnormal movements observed      Gait & Station: Laying/Sitting      Pain Screening: Denies      Nutrition & Dental Concerns: Denies   RISK FORMULATION/ASSESSMENT  Is the patient experiencing any suicidal or homicidal ideations: No        Protective factors considered for safety management: Social support   Risk factors/concerns considered for safety management:  Depression Substance abuse/dependence Impulsivity Aggression Male gender Unmarried  Is there a Astronomer plan with the  patient and treatment team to minimize risk factors and promote protective factors: Yes           Explain: Psychiatric hospitalization  Is crisis care placement or psychiatric hospitalization recommended: Yes     Based on my current evaluation and risk assessment, patient is determined at this time to be at:  High risk  *RISK ASSESSMENT Risk assessment is a dynamic process; it is possible that this patient's condition, and risk level, may change. This should be re-evaluated and managed over time as appropriate. Please re-consult psychiatric consult services if additional assistance is needed in terms of risk assessment and management. If your team decides to discharge this patient, please advise  the patient how to best access emergency psychiatric services, or to call 911, if their condition worsens or they feel unsafe in any way.   Adria Dill, MD Telepsychiatry Consult Services

## 2023-01-02 NOTE — ED Notes (Addendum)
Patient received lunch tray and beverage, no signs of distress.

## 2023-01-02 NOTE — ED Notes (Signed)
Hospital meal provided, pt tolerated w/o complaints.  Waste discarded appropriately.  

## 2023-01-02 NOTE — ED Notes (Addendum)
Patient started banging on table then getting aggressive with security. Stating "I am an aggressive person I like to fight lets fight" Patient then gave the second security guard the middle finger. Patient given lunch tray and said to this RN "Thanks princess. You can go suck a dick now."

## 2023-01-02 NOTE — BH Assessment (Signed)
 Per [facility]), patient to be referred out of system.  Referral information for Psychiatric Hospitalization faxed to;   [facility] [phone]- [phone]) No appropriate beds available.  [facility] [phone]),  [facility] [phone]--- [phone]--- [phone]--- [phone])  [address] [phone]),   [facility] [phone] -or- [phone]),   [facility] [phone]),  [place] [phone])  [facility] [phone])

## 2023-01-02 NOTE — ED Notes (Signed)
Pt in dayroom yelling at this RN. Stating that "you're a jezebel, and if the demons touch you, you will go to hell". Pt redirected easily and went into room.

## 2023-01-02 NOTE — ED Notes (Signed)
IVC pending placement 

## 2023-01-02 NOTE — ED Notes (Signed)
Patient starts screaming and yelling stating something about bringing an assault weapon to his house and yelling other random things not making sense.

## 2023-01-03 DIAGNOSIS — F250 Schizoaffective disorder, bipolar type: Secondary | ICD-10-CM | POA: Diagnosis not present

## 2023-01-03 MED ORDER — DIVALPROEX SODIUM 500 MG PO DR TAB
750.0000 mg | DELAYED_RELEASE_TABLET | Freq: Two times a day (BID) | ORAL | Status: DC
  Administered 2023-01-03 – 2023-01-04 (×3): 750 mg via ORAL
  Filled 2023-01-03 (×3): qty 1

## 2023-01-03 MED ORDER — DIVALPROEX SODIUM 500 MG PO DR TAB
1000.0000 mg | DELAYED_RELEASE_TABLET | Freq: Two times a day (BID) | ORAL | Status: DC
  Administered 2023-01-03: 1000 mg via ORAL
  Filled 2023-01-03: qty 2

## 2023-01-03 MED ORDER — GABAPENTIN 300 MG PO CAPS
300.0000 mg | ORAL_CAPSULE | Freq: Three times a day (TID) | ORAL | Status: DC
  Administered 2023-01-03 – 2023-01-04 (×5): 300 mg via ORAL
  Filled 2023-01-03 (×5): qty 1

## 2023-01-03 NOTE — ED Provider Notes (Addendum)
Emergency Medicine Observation Re-evaluation Note  Jeffrey Velasquez is a 37 y.o. male, seen on rounds today.  Pt initially presented to the ED for complaints of IVC   Currently, the patient is calm, no acute complaints.  Physical Exam   Vitals:   01/02/23 0811 01/02/23 2000  BP: (!) 132/100 105/84  Pulse: 94 86  Resp: 16 17  Temp: 97.6 F (36.4 C) 98.9 F (37.2 C)  SpO2: 96% 98%    Physical Exam Resting comfortably in no distress.  ED Course / MDM  EKG:    I have reviewed the labs performed to date as well as medications administered while in observation.  Recent changes in the last 24 hours include no acute events overnight.    Plan  Current plan is for psych consult (pending for today) Patient is under full IVC at this time.     Sharyn Creamer, MD 01/03/23 4098    Sharyn Creamer, MD 01/03/23 [phone]

## 2023-01-03 NOTE — Consult Note (Signed)
[facility]  Patient Name: .Jeffrey Velasquez  MRN: [id number]  DOB: Feb 11, 1985  Consult Order details:  Orders (From admission, onward)     Start     Ordered   01/01/23 1655  IP CONSULT TO PSYCHIATRY       Ordering Provider: Willy Eddy, MD  Provider:  (Not yet assigned)  Question Answer Comment  Place call to: ed   Reason for Consult Admit      01/01/23 1657   01/01/23 1655  CONSULT TO CALL ACT TEAM       Ordering Provider: Willy Eddy, MD  Provider:  (Not yet assigned)  Question:  Reason for Consult?  Answer:  ed   01/01/23 1657             Mode of Visit: In person    Psychiatry Consult Evaluation  Service Date: January 03, 2023 LOS:  LOS: 0 days  Chief Complaint "I don't want to go back to [facility], I want to stay here."  Primary Psychiatric Diagnoses  Schizoaffective disorder, bipolar type   Assessment  [facility] Velasquez is a 37 y.o. male admitted: Presented to [facility] 01/01/2023  4:51 PM for aggressive behaviors. He carries Jeffrey psychiatric diagnoses of schizoaffective disorder, bipolar type and has a past medical history of  polycythemia, obesity, and hyperlipidemia.   His current presentation of aggression, mood swings, irritability and anger are most consistent with schizoaffective disorder, bipolar type. He meets criteria for schizoaffective disorder, bipolar type  based on symptoms listed above.  Current outpatient psychotropic medications include Depakote, Risperdal and historically he has had a partial response to these medications. He was compliant with medications prior to admission as evidenced by parental reports. On initial examination, patient is calm and cooperative. Please see plan below for detailed recommendations.   Diagnoses:  Active Hospital problems: schizoaffective disorder, bipolar type     Plan   ## Psychiatric Medication Recommendations:  Schizoaffective disorder, bipolar  type: Depakote 500 mg BID increased to 750 mg BID, patient reported 1000 mg BID Abilify 15 mg daily Prozac 60 mg daily Risperdal 2 mg BID  Anxiety: Gabapentin 300 mg TID Hydroxyzine 50 mg TID PRN  ## Medical Decision Making Capacity: Not specifically addressed in this encounter  ## Further Work-up:  -- Depakote level ordered  ## Disposition:-- We recommend inpatient psychiatric hospitalization after medical hospitalization. Patient has been involuntarily committed on 01/02/23.   ## Behavioral / Environmental: - No specific recommendations at this time.     ## Safety and Observation Level:  - Based on my clinical evaluation, I estimate [facility] patient to be at low risk of self harm in Jeffrey current setting. - At this time, we recommend  routine. This decision is based on my review of Jeffrey chart including patient's history and current presentation, interview of Jeffrey patient, mental status examination, and consideration of suicide risk including evaluating suicidal ideation, plan, intent, suicidal or self-harm behaviors, risk factors, and protective factors. This judgment is based on our ability to directly address suicide risk, implement suicide prevention strategies, and develop a safety plan while Jeffrey patient is in Jeffrey clinical setting. Please contact our team if there is a concern that risk level has changed.  CSSR Risk Category:C-SSRS RISK CATEGORY: No Risk  Suicide Risk Assessment: Patient has following modifiable risk factors for suicide: recklessness, which we are addressing by medication management, routine checks. Patient has following non-modifiable or demographic risk factors for suicide: male gender and psychiatric hospitalization  Patient has Jeffrey following protective factors against suicide: Access to outpatient mental health care and Supportive family  Thank you for this consult request. Recommendations have been communicated to Jeffrey primary team.  We will admit to psychiatry at this  time.   [facility] Means, NP       History of Present Illness  Relevant Aspects of Hospital ED Course:  Admitted on 01/01/2023 for aggressive behaviors, history of schizoaffective disorder, bipolar type.  HPI per Dr. Gilman Velasquez on admission: Jeffrey Velasquez is a 37 year old male with a history of schizoaffective disorder, bipolar type and alcohol use disorder who presents to [facility] ED on an IVC for aggressive behavior at home. Per IVC, patient responding to internal stimuli, cooking breakfast at 4:30 AM, aggressive towards his mom and psychiatrist, and psychiatrist was concerned about Jeffrey safety of patient's parents. UDS positive for cannabis, ethanol negative. Psychiatry consulted for disposition recommendations. On evaluation, patient noted to be hyperverbal with pressured speech, expansive, tangential, paranoid, religiously preoccupied, not appearing internally preoccupied, not responding to internal stimuli, alert and oriented x 4. Patient reports belief that [facility] employees at [facility] were speaking ill of him so he got angry and went home angry. Reports he was upset with his psychiatrist who came to [facility] house. Per IVC, patient was aggressive, which he denies. Patient rambling and religiously preoccupied with extensive delusional system. Jeffrey patient's presentation is consistent with schizoaffective disorder, bipolar type; alcohol use disorder; rule out substance induced mood disorder/psychosis. Patient denies suicidal and homicidal ideation. However, patient at high risk for harm to self and others due to delusional thought content, including significant paranoia, aggression, impulsivity, and disorganization. Therefore, inpatient psychiatric hospitalization is recommended.   Today, he is calmly coloring in Jeffrey day room with appropriate behaviors.  He does not want to return to [facility] hospital, [facility], prefers to stay here at [facility].  His depression is "not bad", no suicidal ideations.  Denied anxiety.   Sleep and appetite are "pretty good", denies side effects form his medications.  Patient Report:  "I don't want to go back to [facility] hospital".  Psych ROS:  Depression: low Anxiety:  none Mania (lifetime and current): none Psychosis: (lifetime and current): none  Collateral information:  None collected today  Review of Systems  Psychiatric/Behavioral:  Positive for depression. Jeffrey patient is nervous/anxious.   All other systems reviewed and are negative.    Psychiatric and Social History  Psychiatric History:  Information collected from patient and chart  Prev Dx/Sx: Schizoaffective disorder, bipolar type Current Psych Provider: could not remember Home Meds (current): See MAR Previous Med Trials: unknown Therapy: in [facility] past  Prior Psych Hospitalization: multiple  Prior Self Harm: denies Prior Violence: aggression  Family Psych History: none Family Hx suicide: none  Social History:  Living Situation: parents/ Access to weapons/lethal Velasquez: none   Substance History Alcohol: reported drinking beer frequently despite having none in his system  Type of alcohol beer Last Drink allegedly prior to admission Number of drinks per day varies History of alcohol withdrawal seizures none History of DT's none Tobacco: yes Illicit drugs: denies Prescription drug abuse: cannabis Rehab hx: denies  Exam Findings  Physical Exam: WDL Vital Signs:  Temp:  [97.6 F (36.4 C)-98.9 F (37.2 C)] 98.9 F (37.2 C) (12/24 2000) Pulse Rate:  [86-94] 86 (12/24 2000) Resp:  [16-17] 17 (12/24 2000) BP: (105-132)/(84-100) 105/84 (12/24 2000) SpO2:  [96 %-98 %] 98 % (12/24 2000) Blood pressure 105/84, pulse 86, temperature 98.9  F (37.2 C), temperature source Oral, resp. rate 17, SpO2 98%. There is no height or weight on file to calculate BMI.  Physical Exam Vitals and nursing note reviewed.     Mental Status Exam: General Appearance: Casual  Orientation:  Full (Time, Place, and  Person)  Memory:  Immediate;   Fair Recent;   Fair Remote;   Fair  Concentration:  Concentration: Good and Attention Span: Good  Recall:  Fair  Attention  Good  Eye Contact:  Good  Speech:  Normal Rate  Language:  Good  Volume:  Normal  Mood: depression, low  Affect:  Congruent  Thought Process:  Coherent  Thought Content:  WDL  Suicidal Thoughts:  No  Homicidal Thoughts:  No  Judgement:  Fair  Insight:  Lacking  Psychomotor Activity:  Normal  Akathisia:  No  Fund of Knowledge:  Fair      Assets:  Housing Leisure Time Physical Health Resilience Social Support  Cognition:  WNL  ADL's:  Intact  AIMS (if indicated):        Other History   These have been pulled in through [facility] EMR, reviewed, and updated if appropriate.  Family History:  [facility] patient's family history is not on file.  Medical History: Past Medical History:  Diagnosis Date   Anxiety    Depression    Hyperlipidemia    Polysubstance abuse (HCC) 09/05/2022   Schizophrenia (HCC)     Surgical History: History reviewed. No pertinent surgical history.   Medications:   Current Facility-Administered Medications:    ARIPiprazole (ABILIFY) tablet 15 mg, 15 mg, Oral, Daily, Minna Antis, MD, 15 mg at 01/02/23 1610   atorvastatin (LIPITOR) tablet 40 mg, 40 mg, Oral, Daily, Minna Antis, MD, 40 mg at 01/02/23 9604   benztropine (COGENTIN) tablet 1 mg, 1 mg, Oral, BID, Minna Antis, MD, 1 mg at 01/02/23 2118   buPROPion (WELLBUTRIN XL) 24 hr tablet 300 mg, 300 mg, Oral, Daily, Minna Antis, MD, 300 mg at 01/02/23 5409   cloNIDine (CATAPRES) tablet 0.1 mg, 0.1 mg, Oral, BID, Minna Antis, MD, 0.1 mg at 01/02/23 2118   FLUoxetine (PROZAC) capsule 60 mg, 60 mg, Oral, q morning, Minna Antis, MD, 60 mg at 01/02/23 8119   hydrOXYzine (ATARAX) tablet 50 mg, 50 mg, Oral, TID PRN, [facility], Cloretta Ned, MD   lisinopril (ZESTRIL) tablet 10 mg, 10 mg, Oral, Daily, Minna Antis, MD,  10 mg at 01/02/23 0905   LORazepam (ATIVAN) injection 0-4 mg, 0-4 mg, Intravenous, Q6H **OR** LORazepam (ATIVAN) tablet 0-4 mg, 0-4 mg, Oral, Q6H, Ward, Kristen N, DO   [START ON 01/04/2023] LORazepam (ATIVAN) injection 0-4 mg, 0-4 mg, Intravenous, Q12H **OR** [START ON 01/04/2023] LORazepam (ATIVAN) tablet 0-4 mg, 0-4 mg, Oral, Q12H, Ward, Kristen N, DO   metFORMIN (GLUCOPHAGE) tablet 500 mg, 500 mg, Oral, Daily, Paduchowski, Caryn Bee, MD, 500 mg at 01/02/23 0908   OLANZapine (ZYPREXA) injection 10 mg, 10 mg, Intramuscular, Once PRN, Feller, Sophie C, MD   OLANZapine zydis (ZYPREXA) disintegrating tablet 10 mg, 10 mg, Oral, TID PRN, [facility], Sophie C, MD   pantoprazole (PROTONIX) EC tablet 40 mg, 40 mg, Oral, BID, Minna Antis, MD, 40 mg at 01/02/23 2118   propranolol (INDERAL) tablet 10 mg, 10 mg, Oral, BID, Minna Antis, MD, 10 mg at 01/02/23 2126   risperiDONE (RISPERDAL) tablet 2 mg, 2 mg, Oral, QHS, Minna Antis, MD, 2 mg at 01/02/23 2118   thiamine (VITAMIN B1) tablet 100 mg, 100 mg, Oral, Daily, 100 mg  at 01/02/23 0905 **OR** thiamine (VITAMIN B1) injection 100 mg, 100 mg, Intravenous, Daily, Ward, Layla Maw, DO  Current Outpatient Medications:    ABILIFY MAINTENA 400 MG PRSY prefilled syringe, Inject 400 mg into Jeffrey muscle every 30 (thirty) days., Disp: , Rfl:    ARIPiprazole (ABILIFY) 15 MG tablet, Take 15 mg by mouth daily., Disp: , Rfl:    atorvastatin (LIPITOR) 40 MG tablet, Take 1 tablet (40 mg total) by mouth daily., Disp: 90 tablet, Rfl: 3   benztropine (COGENTIN) 1 MG tablet, Take 1 mg by mouth 2 (two) times daily., Disp: , Rfl:    buPROPion (WELLBUTRIN XL) 300 MG 24 hr tablet, Take 300 mg by mouth daily., Disp: , Rfl:    cloNIDine (CATAPRES) 0.1 MG tablet, Take 0.1 mg by mouth 2 (two) times daily., Disp: , Rfl:    divalproex (DEPAKOTE ER) 500 MG 24 hr tablet, Take 1,000 mg by mouth in Jeffrey morning and at bedtime., Disp: , Rfl:    FLUoxetine (PROZAC) 20 MG capsule,  Take 60 mg by mouth every morning., Disp: , Rfl:    lisinopril (ZESTRIL) 5 MG tablet, TAKE 1 TABLET BY MOUTH DAILY FOR HYPERTENSION, Disp: 90 tablet, Rfl: 3   metFORMIN (GLUCOPHAGE) 500 MG tablet, Take 500 mg by mouth daily., Disp: , Rfl:    pantoprazole (PROTONIX) 40 MG tablet, Take 40 mg by mouth 2 (two) times daily., Disp: , Rfl:    propranolol (INDERAL) 10 MG tablet, Take 10 mg by mouth 2 (two) times daily., Disp: , Rfl:    risperiDONE (RISPERDAL) 2 MG tablet, Take 2 mg by mouth at bedtime., Disp: , Rfl:    sildenafil (VIAGRA) 50 MG tablet, Take 1 tablet (50 mg total) by mouth daily as needed for erectile dysfunction., Disp: 10 tablet, Rfl: 0   ARIPiprazole (ABILIFY) 10 MG tablet, Take 10 mg by mouth daily. (Patient not taking: Reported on 01/01/2023), Disp: , Rfl:    ARIPiprazole (ABILIFY) 5 MG tablet, Take 5 mg by mouth daily. (Patient not taking: Reported on 01/01/2023), Disp: , Rfl:    BREZTRI AEROSPHERE 160-9-4.8 MCG/ACT AERO, Inhale 2 puffs into Jeffrey lungs in Jeffrey morning and at bedtime. (Patient not taking: Reported on 01/01/2023), Disp: 10.7 g, Rfl: 5   OLANZapine (ZYPREXA) 10 MG tablet, Take 10 mg by mouth at bedtime. (Patient not taking: Reported on 01/01/2023), Disp: , Rfl:   Allergies: Allergies  Allergen Reactions   Clonazepam    Shellfish Allergy     Jeff[facility] Means, NP

## 2023-01-03 NOTE — ED Notes (Signed)
Morning snack given; Pt tolerated well; Waste disposed of properly

## 2023-01-03 NOTE — ED Notes (Signed)
Patient received supper tray and beverage, no signs of distress noted, He has been sitting in the dayroom reading a Bible.

## 2023-01-03 NOTE — ED Notes (Signed)
Breakfast tray provided; Pt tolerated well; waste disposed of properly

## 2023-01-03 NOTE — ED Notes (Signed)
Patient parents came in to visit one at a time, visit was supervised, patient remained calm and cooperative, No behavioral issues noted.

## 2023-01-03 NOTE — ED Notes (Signed)
Hospital meal provided, pt tolerated w/o complaints.  Waste discarded appropriately.  

## 2023-01-03 NOTE — ED Notes (Signed)
Patient is sitting in the dayroom, no signs of distress, He is coloring and watching tv, staff will continue to monitor for safety.

## 2023-01-03 NOTE — ED Notes (Signed)
ivc/consult done/pt recommended for inpatient psychiatric hospitalization.

## 2023-01-03 NOTE — ED Notes (Signed)
Patient with lab draw ordered, Tech is obtaining at this time. Patient without any behavioral issues noted.

## 2023-01-04 ENCOUNTER — Other Ambulatory Visit: Payer: Self-pay | Admitting: Family

## 2023-01-04 DIAGNOSIS — F250 Schizoaffective disorder, bipolar type: Secondary | ICD-10-CM | POA: Diagnosis not present

## 2023-01-04 LAB — VALPROIC ACID LEVEL: Valproic Acid Lvl: <10 ug/mL — ABNORMAL LOW (ref 50.0–100.0)

## 2023-01-04 NOTE — ED Notes (Signed)
Lunch tray provided. 

## 2023-01-04 NOTE — ED Notes (Signed)
IVC  GOING TO [facility] ON 01/05/23

## 2023-01-04 NOTE — ED Notes (Signed)
Pt handed a colored sheet of paper and stated it was for staff, paper contains religious rants.  Mr [facility] then walked away staff did not engage

## 2023-01-04 NOTE — ED Notes (Addendum)
Pt accepted to [facility] Adult unit, Accepting doc is G. Childers Spoke to [place] in admissions, Report to be called to [phone]

## 2023-01-04 NOTE — ED Notes (Addendum)
PT has come to the door asking for water 3xs in the past 30 mins. Provided.

## 2023-01-04 NOTE — ED Notes (Signed)
Pt very concentrated on peer, stating that staff "should give her a bath, she'd feel better"  Staff remined [facility] that he should not worry about his peers and continue to concentrate on himself.  Pt became agitated and walked away

## 2023-01-04 NOTE — ED Notes (Signed)
Pt has come to nursing station 3x's in <5 min.s  Pt initially requested breakfast, he then stated that he would like to have inpatient tx.  2nd occurrence pt stated that he has "been good the past two days"  staff acknowledged this and stated "lets keep this trend going"  3rd occurrence pt requested "if I can be good for 3 days I can go home right?"  Staff explained that decision was not made by nursing, however his concerns would be passed onto the on-call psych attending

## 2023-01-04 NOTE — ED Provider Notes (Signed)
Emergency Medicine Observation Re-evaluation Note  [facility] Velasquez is a 36 y.o. male, seen on rounds today.  Pt initially presented to the ED for complaints of IVC     Physical Exam  BP (!) 119/52 (BP Location: Right Arm)   Pulse 81   Temp 97.8 F (36.6 C) (Oral)   Resp 16   SpO2 97%  Physical Exam General: nad  ED Course / MDM  EKG:   I have reviewed the labs performed to date as well as medications administered while in observation.     Plan  Current plan is for psych.    Willy Eddy, MD 01/04/23 [phone]

## 2023-01-04 NOTE — ED Notes (Signed)
Mr Tiso continues to attempt to be involved with the Tx of peers, requesting to "look at their medications" as well as attempting to direct staff on the care

## 2023-01-04 NOTE — ED Notes (Signed)
Pt provided with breakfast tray. Pt sitting up eating

## 2023-01-04 NOTE — ED Notes (Signed)
IVC/Pending Placement 

## 2023-01-04 NOTE — BH Assessment (Signed)
Per [facility] AC Tresa Endo S.), patient to be referred out of system.  Updated Referral information for Psychiatric Hospitalization faxed to;   [facility] [phone]- [phone]), no appropriate bed.  [facility] [phone]),   [address] [phone]),  [place] [phone], [phone], [phone] or [phone]),   [place] [phone]--- [phone]--- [phone]--- [phone])  [address] [phone]),   [facility] [phone] -or- [phone]),   [facility] [phone] phone-- [phone])  [facility] [phone]),  [place] [phone])  [facility] [phone]).  [facility] [phone])

## 2023-01-04 NOTE — BH Assessment (Signed)
Patient has been accepted to [facility].  Patient assigned to [facility] Accepting physician is Dr. Riley Churches.  Call report to [phone].  Representative was Casimiro Needle.   ER Staff is aware of it:  Misty Stanley, ER Eulis Canner, Patient's Nurse      Address: [address]  [place], [place] [address]  Bed is available tomorrow (01/05/2023), after 8am.

## 2023-01-04 NOTE — ED Notes (Signed)
This tech provided pt with dinner tray.

## 2023-01-04 NOTE — ED Notes (Signed)
Asked pt if they wanted to take a shower. Pt refused shower.

## 2023-01-04 NOTE — ED Notes (Signed)
Pt given snack in dayroom; Pt tolerated well; Waste disposed of properly

## 2023-01-04 NOTE — ED Notes (Signed)
PT IVC/ RECOMMENDED FOR INPATIENT PSYCH HOSPITALIZATION.

## 2023-01-05 DIAGNOSIS — E785 Hyperlipidemia, unspecified: Secondary | ICD-10-CM | POA: Diagnosis not present

## 2023-01-05 DIAGNOSIS — E119 Type 2 diabetes mellitus without complications: Secondary | ICD-10-CM | POA: Diagnosis not present

## 2023-01-05 DIAGNOSIS — I10 Essential (primary) hypertension: Secondary | ICD-10-CM | POA: Diagnosis not present

## 2023-01-05 NOTE — ED Notes (Signed)
Called for sheriff's transport to [facility]

## 2023-01-05 NOTE — ED Notes (Signed)
Pt is A/Ox 3, [facility] declines any SI/HI stated that he is not  currently having Auditory/Visual hallucinations.  Inpatient admission discussed with [facility], he verbalized understanding.  All Belongings accounted for and returned to PT.  Pt left ambulatory via [facility]

## 2023-01-05 NOTE — ED Notes (Signed)
RN noted pt sitting in day room, reading his bible, after using the bathroom.  RN told patient that he can sit and read out here.  Later tech noted pt doing pushups in day room and told patient to return to his room.  Pt returned to his room.    Approximately 5 minutes later tech was going to assist another pt in their room.  This pt returned to the day room and stated to tech "shut up I'm going to read the bible to you."  This RN sitting in the nurses station heard pt reading loudly.  RN entered day room and told pt to lower his voice.  Pt states "or what?"  RN asked pt what he means by that as it "sounds like a threat."  Pt states "you heard me, I said or what."  RN explained to pt that aggression is not tolerated and he would be sedated and or restrained as necessary to protect the staff.  RN instructed security to call for backup.  Pt rose from chair, swung his bible at this RN.  RN approached pt in preparation for STARR takedown and pt then returned to room.  RN asked pt "why are you angry right now?"  Pt stated "because you are such a faggot.  You should read the bible to see how big of a faggot you are."    Pt is currently sitting up on side of bed reading bible.

## 2023-01-05 NOTE — ED Provider Notes (Signed)
Emergency Medicine Observation Re-evaluation Note  [facility]as[facility] is a 37 y.o. male, seen on rounds today.  Pt initially presented to the ED for complaints of IVC   Currently, the patient is calm, no acute complaints.  Physical Exam  Blood pressure 132/76, pulse (!) 105, temperature 98.9 F (37.2 C), temperature source Oral, resp. rate 19, SpO2 94%. Physical Exam General: NAD Lungs: CTAB Psych: not agitated  ED Course / MDM  EKG:    I have reviewed the labs performed to date as well as medications administered while in observation.  Recent changes in the last 24 hours include no acute events overnight.    Plan  Current plan is for inpatient psych. Patient is under full IVC at this time.   Sharman Cheek, MD 01/05/23 [phone]

## 2023-01-05 NOTE — ED Notes (Signed)
2nd report, depart report called to [facility], HIPAA compliant msg left on report line [phone]

## 2023-01-29 DIAGNOSIS — G4733 Obstructive sleep apnea (adult) (pediatric): Secondary | ICD-10-CM | POA: Diagnosis not present

## 2023-02-27 ENCOUNTER — Encounter: Payer: Self-pay | Admitting: Oncology

## 2023-03-05 ENCOUNTER — Encounter: Payer: Self-pay | Admitting: Family

## 2023-03-05 ENCOUNTER — Ambulatory Visit: Payer: 59 | Admitting: Family

## 2023-03-05 DIAGNOSIS — Z0001 Encounter for general adult medical examination with abnormal findings: Secondary | ICD-10-CM | POA: Diagnosis not present

## 2023-03-05 DIAGNOSIS — J449 Chronic obstructive pulmonary disease, unspecified: Secondary | ICD-10-CM | POA: Diagnosis not present

## 2023-03-05 DIAGNOSIS — R7303 Prediabetes: Secondary | ICD-10-CM | POA: Diagnosis not present

## 2023-03-05 DIAGNOSIS — R5383 Other fatigue: Secondary | ICD-10-CM | POA: Diagnosis not present

## 2023-03-05 DIAGNOSIS — Z0000 Encounter for general adult medical examination without abnormal findings: Secondary | ICD-10-CM

## 2023-03-05 DIAGNOSIS — Z0130 Encounter for examination of blood pressure without abnormal findings: Secondary | ICD-10-CM

## 2023-03-05 DIAGNOSIS — E559 Vitamin D deficiency, unspecified: Secondary | ICD-10-CM

## 2023-03-05 DIAGNOSIS — G4733 Obstructive sleep apnea (adult) (pediatric): Secondary | ICD-10-CM

## 2023-03-05 DIAGNOSIS — E785 Hyperlipidemia, unspecified: Secondary | ICD-10-CM | POA: Diagnosis not present

## 2023-03-05 DIAGNOSIS — F250 Schizoaffective disorder, bipolar type: Secondary | ICD-10-CM

## 2023-03-05 DIAGNOSIS — E538 Deficiency of other specified B group vitamins: Secondary | ICD-10-CM

## 2023-03-05 MED ORDER — BREZTRI AEROSPHERE 160-9-4.8 MCG/ACT IN AERO: 2.0000 | INHALATION_SPRAY | Freq: Two times a day (BID) | RESPIRATORY_TRACT | 5 refills | Status: AC

## 2023-03-05 MED ORDER — SILDENAFIL CITRATE 50 MG PO TABS: 50.0000 mg | ORAL_TABLET | Freq: Every day | ORAL | 3 refills | Status: AC | PRN

## 2023-03-05 NOTE — Progress Notes (Signed)
 Complete physical exam  Patient: Jeffrey Velasquez   DOB: [birth year]   38 y.o. Male  MRN: [id number]  Subjective:    Chief Complaint  Patient presents with   Annual Exam    CPE    Jeffrey Velasquez is a 38 y.o. male who presents today for a complete physical exam. He reports consuming a general diet. Gym/ health club routine includes cardio and mod to heavy weightlifting. He generally feels fairly well. He reports sleeping poorly. He does have additional problems to discuss today.    Most recent fall risk assessment:    04/25/2019    3:00 PM  Fall Risk   Falls in the past year? 0     Most recent depression screenings:     No data to display          Past Medical History:  Diagnosis Date   Anxiety    Depression    Hyperlipidemia    Polysubstance abuse (HCC) 09/05/2022   Schizophrenia (HCC)     History reviewed. No pertinent surgical history.  History reviewed. No pertinent family history.  Social History   Socioeconomic History   Marital status: Single    Spouse name: Not on file   Number of children: Not on file   Years of education: Not on file   Highest education level: Not on file  Occupational History   Not on file  Tobacco Use   Smoking status: Every Day    Current packs/day: 2.00    Types: Cigarettes   Smokeless tobacco: Never  Vaping Use   Vaping status: Never Used  Substance and Sexual Activity   Alcohol use: Yes   Drug use: Yes    Types: Marijuana    Comment: Daily   Sexual activity: Not on file  Other Topics Concern   Not on file  Social History Narrative   Not on file   Social Drivers of Health   Financial Resource Strain: Low Risk  (10/31/2022)   Received from [facility]   Overall Financial Resource Strain (CARDIA)    Difficulty of Paying Living Expenses: Not hard at all  Food Insecurity: No Food Insecurity (10/31/2022)   Received from [facility]   Hunger Vital Sign    Worried  About Running Out of Food in the Last Year: Never true    Ran Out of Food in the Last Year: Never true  Transportation Needs: No Transportation Needs (10/31/2022)   Received from [facility] - Transportation    In the past 12 months, has lack of transportation kept you from medical appointments or from getting medications?: No    Lack of Transportation (Non-Medical): No  Physical Activity: Not on file  Stress: Not on file  Social Connections: Not on file  Intimate Partner Violence: Not on file    Outpatient Medications Prior to Visit  Medication Sig   ARIPiprazole  (ABILIFY ) 10 MG tablet Take 10 mg by mouth daily. (Patient not taking: Reported on 04/02/2023)   atorvastatin  (LIPITOR) 40 MG tablet TAKE 1 TABLET BY MOUTH DAILY   benztropine  (COGENTIN ) 1 MG tablet Take 1 mg by mouth 2 (two) times daily.   cloNIDine  (CATAPRES ) 0.1 MG tablet Take 0.1 mg by mouth 2 (two) times daily.   divalproex  (DEPAKOTE  ER) 500 MG 24 hr tablet Take 500 mg by mouth daily. (Patient not taking: Reported on 04/02/2023)   divalproex  (DEPAKOTE ) 250 MG DR tablet Take 750 mg  by mouth 2 (two) times daily. (Patient not taking: Reported on 04/02/2023)   FLUoxetine  (PROZAC ) 20 MG capsule Take 60 mg by mouth every morning.   fluPHENAZine (PROLIXIN) 2.5 MG tablet Take 7.5 mg by mouth 2 (two) times daily.   lisinopril  (ZESTRIL ) 5 MG tablet TAKE 1 TABLET BY MOUTH DAILY FOR HYPERTENSION   metFORMIN  (GLUCOPHAGE ) 500 MG tablet Take 500 mg by mouth daily.   pantoprazole  (PROTONIX ) 40 MG tablet Take 40 mg by mouth 2 (two) times daily.   [DISCONTINUED] BREZTRI  AEROSPHERE 160-9-4.8 MCG/ACT AERO Inhale 2 puffs into the lungs in the morning and at bedtime.   [DISCONTINUED] ABILIFY  MAINTENA 400 MG PRSY prefilled syringe Inject 400 mg into the muscle every 30 (thirty) days. (Patient not taking: Reported on 03/05/2023)   [DISCONTINUED] ARIPiprazole  (ABILIFY ) 15 MG tablet Take 15 mg by mouth daily. (Patient not  taking: Reported on 03/05/2023)   [DISCONTINUED] ARIPiprazole  (ABILIFY ) 5 MG tablet Take 5 mg by mouth daily. (Patient not taking: Reported on 01/01/2023)   [DISCONTINUED] buPROPion  (WELLBUTRIN  XL) 300 MG 24 hr tablet Take 300 mg by mouth daily. (Patient not taking: Reported on 03/05/2023)   [DISCONTINUED] OLANZapine  (ZYPREXA ) 10 MG tablet Take 10 mg by mouth at bedtime. (Patient not taking: Reported on 03/05/2023)   [DISCONTINUED] propranolol  (INDERAL ) 10 MG tablet Take 10 mg by mouth 2 (two) times daily. (Patient not taking: Reported on 03/05/2023)   [DISCONTINUED] risperiDONE  (RISPERDAL ) 2 MG tablet Take 2 mg by mouth at bedtime. (Patient not taking: Reported on 03/05/2023)   [DISCONTINUED] sildenafil  (VIAGRA ) 50 MG tablet Take 1 tablet (50 mg total) by mouth daily as needed for erectile dysfunction. (Patient not taking: Reported on 03/05/2023)   No facility-administered medications prior to visit.    Review of Systems  All other systems reviewed and are negative.       Objective:     BP 130/64   Pulse 95   Ht 6' (1.829 m)   Wt (!) 340 lb (154.2 kg)   SpO2 94%   BMI 46.11 kg/m    Physical Exam Vitals and nursing note reviewed.  Constitutional:      Appearance: Normal appearance. He is normal weight.  HENT:     Head: Normocephalic and atraumatic.  Eyes:     Extraocular Movements: Extraocular movements intact.     Conjunctiva/sclera: Conjunctivae normal.     Pupils: Pupils are equal, round, and reactive to light.  Cardiovascular:     Rate and Rhythm: Normal rate and regular rhythm.     Pulses: Normal pulses.     Heart sounds: Normal heart sounds.  Pulmonary:     Effort: Pulmonary effort is normal.     Breath sounds: Normal breath sounds.  Abdominal:     General: Abdomen is flat.  Musculoskeletal:        General: Normal range of motion.     Cervical back: Normal range of motion.  Neurological:     General: No focal deficit present.     Mental Status: He is alert and  oriented to person, place, and time.  Psychiatric:        Mood and Affect: Mood normal.        Behavior: Behavior normal.        Thought Content: Thought content normal.        Judgment: Judgment normal.      Results for orders placed or performed in visit on 03/05/23  Lipid panel  Result Value Ref Range   Cholesterol,  Total 115 100 - 199 mg/dL   Triglycerides 119 0 - 149 mg/dL   HDL 31 (L) >14 mg/dL   VLDL Cholesterol Cal 21 5 - 40 mg/dL   LDL Chol Calc (NIH) 63 0 - 99 mg/dL   Chol/HDL Ratio 3.7 0.0 - 5.0 ratio  VITAMIN D  25 Hydroxy (Vit-D Deficiency, Fractures)  Result Value Ref Range   Vit D, 25-Hydroxy 21.9 (L) 30.0 - 100.0 ng/mL  CMP14+EGFR  Result Value Ref Range   Glucose 78 70 - 99 mg/dL   BUN 16 6 - 20 mg/dL   Creatinine, Ser 7.82 0.76 - 1.27 mg/dL   eGFR 956 >21 HY/QMV/7.84   BUN/Creatinine Ratio 18 9 - 20   Sodium 138 134 - 144 mmol/L   Potassium 4.5 3.5 - 5.2 mmol/L   Chloride 100 96 - 106 mmol/L   CO2 22 20 - 29 mmol/L   Calcium  9.7 8.7 - 10.2 mg/dL   Total Protein 6.6 6.0 - 8.5 g/dL   Albumin 4.3 4.1 - 5.1 g/dL   Globulin, Total 2.3 1.5 - 4.5 g/dL   Bilirubin Total 0.4 0.0 - 1.2 mg/dL   Alkaline Phosphatase 56 44 - 121 IU/L   AST 56 (H) 0 - 40 IU/L   ALT 104 (H) 0 - 44 IU/L  TSH  Result Value Ref Range   TSH 1.910 0.450 - 4.500 uIU/mL  Hemoglobin A1c  Result Value Ref Range   Hgb A1c MFr Bld 5.6 4.8 - 5.6 %   Est. average glucose Bld gHb Est-mCnc 114 mg/dL  Vitamin O96  Result Value Ref Range   Vitamin B-12 663 232 - 1,245 pg/mL  CBC with Diff  Result Value Ref Range   WBC 10.7 3.4 - 10.8 x10E3/uL   RBC 5.36 4.14 - 5.80 x10E6/uL   Hemoglobin 16.3 13.0 - 17.7 g/dL   Hematocrit 29.5 28.4 - 51.0 %   MCV 92 79 - 97 fL   MCH 30.4 26.6 - 33.0 pg   MCHC 33.1 31.5 - 35.7 g/dL   RDW 13.2 44.0 - 10.2 %   Platelets 189 150 - 450 x10E3/uL   Neutrophils 58 Not Estab. %   Lymphs 26 Not Estab. %   Monocytes 8 Not Estab. %   Eos 6 Not Estab. %   Basos 1  Not Estab. %   Neutrophils Absolute 6.3 1.4 - 7.0 x10E3/uL   Lymphocytes Absolute 2.8 0.7 - 3.1 x10E3/uL   Monocytes Absolute 0.8 0.1 - 0.9 x10E3/uL   EOS (ABSOLUTE) 0.6 (H) 0.0 - 0.4 x10E3/uL   Basophils Absolute 0.1 0.0 - 0.2 x10E3/uL   Immature Granulocytes 1 Not Estab. %   Immature Grans (Abs) 0.1 0.0 - 0.1 x10E3/uL    Recent Results (from the past 2160 hours)  Lipid panel     Status: Abnormal   Collection Time: 03/05/23  1:33 PM  Result Value Ref Range   Cholesterol, Total 115 100 - 199 mg/dL   Triglycerides 725 0 - 149 mg/dL   HDL 31 (L) >36 mg/dL   VLDL Cholesterol Cal 21 5 - 40 mg/dL   LDL Chol Calc (NIH) 63 0 - 99 mg/dL   Chol/HDL Ratio 3.7 0.0 - 5.0 ratio    Comment:                                   T. Chol/HDL Ratio  Men  Women                               1/2 Avg.Risk  3.4    3.3                                   Avg.Risk  5.0    4.4                                2X Avg.Risk  9.6    7.1                                3X Avg.Risk 23.4   11.0   VITAMIN D  25 Hydroxy (Vit-D Deficiency, Fractures)     Status: Abnormal   Collection Time: 03/05/23  1:33 PM  Result Value Ref Range   Vit D, 25-Hydroxy 21.9 (L) 30.0 - 100.0 ng/mL    Comment: Vitamin D  deficiency has been defined by the Institute of Medicine and an Endocrine Society practice guideline as a level of serum 25-OH vitamin D  less than 20 ng/mL (1,2). The Endocrine Society went on to further define vitamin D  insufficiency as a level between 21 and 29 ng/mL (2). 1. IOM (Institute of Medicine). 2010. Dietary reference    intakes for calcium  and D. [place]: The    [employer]. 2. Holick MF, Bink[place], Bischoff-Ferrari HA, et al.    Evaluation, treatment, and prevention of vitamin D     deficiency: an Endocrine Society clinical practice    guideline. JCEM. 2011 Jul; 96(7):1911-30.   CMP14+EGFR     Status: Abnormal   Collection Time: 03/05/23  1:33 PM   Result Value Ref Range   Glucose 78 70 - 99 mg/dL   BUN 16 6 - 20 mg/dL   Creatinine, Ser 1.61 0.76 - 1.27 mg/dL   eGFR 096 >04 VW/UJW/1.19   BUN/Creatinine Ratio 18 9 - 20   Sodium 138 134 - 144 mmol/L   Potassium 4.5 3.5 - 5.2 mmol/L   Chloride 100 96 - 106 mmol/L   CO2 22 20 - 29 mmol/L   Calcium  9.7 8.7 - 10.2 mg/dL   Total Protein 6.6 6.0 - 8.5 g/dL   Albumin 4.3 4.1 - 5.1 g/dL   Globulin, Total 2.3 1.5 - 4.5 g/dL   Bilirubin Total 0.4 0.0 - 1.2 mg/dL   Alkaline Phosphatase 56 44 - 121 IU/L   AST 56 (H) 0 - 40 IU/L   ALT 104 (H) 0 - 44 IU/L  TSH     Status: None   Collection Time: 03/05/23  1:33 PM  Result Value Ref Range   TSH 1.910 0.450 - 4.500 uIU/mL  Hemoglobin A1c     Status: None   Collection Time: 03/05/23  1:33 PM  Result Value Ref Range   Hgb A1c MFr Bld 5.6 4.8 - 5.6 %    Comment:          Prediabetes: 5.7 - 6.4          Diabetes: >6.4          Glycemic control for adults with diabetes: <7.0    Est. average glucose Bld gHb Est-mCnc 114 mg/dL  Vitamin B12  Status: None   Collection Time: 03/05/23  1:33 PM  Result Value Ref Range   Vitamin B-12 663 232 - 1,245 pg/mL  CBC with Diff     Status: Abnormal   Collection Time: 03/05/23  1:33 PM  Result Value Ref Range   WBC 10.7 3.4 - 10.8 x10E3/uL   RBC 5.36 4.14 - 5.80 x10E6/uL   Hemoglobin 16.3 13.0 - 17.7 g/dL   Hematocrit 40.9 81.1 - 51.0 %   MCV 92 79 - 97 fL   MCH 30.4 26.6 - 33.0 pg   MCHC 33.1 31.5 - 35.7 g/dL   RDW 91.4 78.2 - 95.6 %   Platelets 189 150 - 450 x10E3/uL   Neutrophils 58 Not Estab. %   Lymphs 26 Not Estab. %   Monocytes 8 Not Estab. %   Eos 6 Not Estab. %   Basos 1 Not Estab. %   Neutrophils Absolute 6.3 1.4 - 7.0 x10E3/uL   Lymphocytes Absolute 2.8 0.7 - 3.1 x10E3/uL   Monocytes Absolute 0.8 0.1 - 0.9 x10E3/uL   EOS (ABSOLUTE) 0.6 (H) 0.0 - 0.4 x10E3/uL   Basophils Absolute 0.1 0.0 - 0.2 x10E3/uL   Immature Granulocytes 1 Not Estab. %   Immature Grans (Abs) 0.1 0.0 - 0.1  x10E3/uL        Assessment & Plan:    Routine Health Maintenance and Physical Exam   There is no immunization history on file for this patient.  Health Maintenance  Topic Date Due   Medicare Annual Wellness (AWV)  Never done   HIV Screening  Never done   Hepatitis C Screening  Never done   DTaP/Tdap/Td (1 - Tdap) Never done   Pneumococcal Vaccine 44-59 Years old (1 of 2 - PCV) Never done   COVID-19 Vaccine (1 - 2024-25 season) Never done   INFLUENZA VACCINE  08/10/2023   HPV VACCINES  Aged Out   Meningococcal B Vaccine  Aged Out    Discussed health benefits of physical activity, and encouraged him to engage in regular exercise appropriate for his age and condition.  Problem List Items Addressed This Visit       Respiratory   COPD, mild (HCC)   Patient stable.  Well controlled with current therapy.   Continue current meds.        Relevant Medications   BREZTRI  AEROSPHERE 160-9-4.8 MCG/ACT AERO   Obstructive sleep apnea   Patient stable.  Well controlled with current therapy.   Continue current meds.        Relevant Orders   CMP14+EGFR (Completed)   CBC with Diff (Completed)     Other   Hyperlipidemia   Checking labs today.  Continue current therapy for lipid control. Will modify as needed based on labwork results.       Relevant Medications   sildenafil  (VIAGRA ) 50 MG tablet   Other Relevant Orders   Lipid panel (Completed)   CMP14+EGFR (Completed)   CBC with Diff (Completed)   Obesity, morbid (HCC) - Primary   Continue current meds.  Will adjust as needed based on results.  The patient is asked to make an attempt to improve diet and exercise patterns to aid in medical management of this problem. Addressed importance of increasing and maintaining water intake.        Relevant Orders   CMP14+EGFR (Completed)   CBC with Diff (Completed)   Schizoaffective disorder, bipolar type [facility])   Patient is seen by Psychiatry, who manage this condition.   He  is well controlled with current therapy.   Will defer to them for further changes to plan of care.       Prediabetes   A1C Continues to be in prediabetic ranges.  Will reassess at follow up after next lab check.  Patient counseled on dietary choices and verbalized understanding.        Relevant Orders   CMP14+EGFR (Completed)   Hemoglobin A1c (Completed)   CBC with Diff (Completed)   Vitamin D  deficiency, unspecified   Checking labs today.  Will continue supplements as needed.        Relevant Orders   VITAMIN D  25 Hydroxy (Vit-D Deficiency, Fractures) (Completed)   CMP14+EGFR (Completed)   CBC with Diff (Completed)   Other Visit Diagnoses       B12 deficiency due to diet       Relevant Orders   CMP14+EGFR (Completed)   Vitamin B12 (Completed)   CBC with Diff (Completed)     Other fatigue       Relevant Orders   CMP14+EGFR (Completed)   TSH (Completed)   CBC with Diff (Completed)     Routine general medical examination at a health care facility          Return in 3 months (on 06/02/2023) for F/U.     Trenda Frisk, FNP  03/05/2023   This document may have been prepared by [facility] Voice Recognition software and as such may include unintentional dictation errors.

## 2023-03-05 NOTE — Patient Instructions (Addendum)
 Restart Breztri Inhaler - samples given Setting up new sleep study.   Labs today - will call with results.  Will decide meds based on the results for weight loss.

## 2023-03-06 LAB — CBC WITH DIFFERENTIAL/PLATELET
Basophils Absolute: 0.1 10*3/uL (ref 0.0–0.2)
Basos: 1 %
EOS (ABSOLUTE): 0.6 10*3/uL — ABNORMAL HIGH (ref 0.0–0.4)
Eos: 6 %
Hematocrit: 49.3 % (ref 37.5–51.0)
Hemoglobin: 16.3 g/dL (ref 13.0–17.7)
Immature Grans (Abs): 0.1 10*3/uL (ref 0.0–0.1)
Immature Granulocytes: 1 %
Lymphocytes Absolute: 2.8 10*3/uL (ref 0.7–3.1)
Lymphs: 26 %
MCH: 30.4 pg (ref 26.6–33.0)
MCHC: 33.1 g/dL (ref 31.5–35.7)
MCV: 92 fL (ref 79–97)
Monocytes Absolute: 0.8 10*3/uL (ref 0.1–0.9)
Monocytes: 8 %
Neutrophils Absolute: 6.3 10*3/uL (ref 1.4–7.0)
Neutrophils: 58 %
Platelets: 189 10*3/uL (ref 150–450)
RBC: 5.36 x10E6/uL (ref 4.14–5.80)
RDW: 12.3 % (ref 11.6–15.4)
WBC: 10.7 10*3/uL (ref 3.4–10.8)

## 2023-03-06 LAB — CMP14+EGFR
ALT: 104 [IU]/L — ABNORMAL HIGH (ref 0–44)
AST: 56 [IU]/L — ABNORMAL HIGH (ref 0–40)
Albumin: 4.3 g/dL (ref 4.1–5.1)
Alkaline Phosphatase: 56 [IU]/L (ref 44–121)
BUN/Creatinine Ratio: 18 (ref 9–20)
BUN: 16 mg/dL (ref 6–20)
Bilirubin Total: 0.4 mg/dL (ref 0.0–1.2)
CO2: 22 mmol/L (ref 20–29)
Calcium: 9.7 mg/dL (ref 8.7–10.2)
Chloride: 100 mmol/L (ref 96–106)
Creatinine, Ser: 0.9 mg/dL (ref 0.76–1.27)
Globulin, Total: 2.3 g/dL (ref 1.5–4.5)
Glucose: 78 mg/dL (ref 70–99)
Potassium: 4.5 mmol/L (ref 3.5–5.2)
Sodium: 138 mmol/L (ref 134–144)
Total Protein: 6.6 g/dL (ref 6.0–8.5)
eGFR: 111 mL/min/{1.73_m2} (ref >59)

## 2023-03-06 LAB — LIPID PANEL
Chol/HDL Ratio: 3.7 {ratio} (ref 0.0–5.0)
Cholesterol, Total: 115 mg/dL (ref 100–199)
HDL: 31 mg/dL — ABNORMAL LOW (ref >39)
LDL Chol Calc (NIH): 63 mg/dL (ref 0–99)
Triglycerides: 111 mg/dL (ref 0–149)
VLDL Cholesterol Cal: 21 mg/dL (ref 5–40)

## 2023-03-06 LAB — HEMOGLOBIN A1C
Est. average glucose Bld gHb Est-mCnc: 114 mg/dL
Hgb A1c MFr Bld: 5.6 % (ref 4.8–5.6)

## 2023-03-06 LAB — TSH: TSH: 1.91 u[IU]/mL (ref 0.450–4.500)

## 2023-03-06 LAB — VITAMIN B12: Vitamin B-12: 663 pg/mL (ref 232–1245)

## 2023-03-06 LAB — VITAMIN D 25 HYDROXY (VIT D DEFICIENCY, FRACTURES): Vit D, 25-Hydroxy: 21.9 ng/mL — ABNORMAL LOW (ref 30.0–100.0)

## 2023-03-22 ENCOUNTER — Telehealth: Payer: Self-pay

## 2023-03-22 NOTE — Telephone Encounter (Signed)
 Per Marchelle Folks verbal please let pt know that the Vitamin D is low make sure he's taking the rx otherwise the liver enzymes are  elevated, need to watch fried and fatty foods

## 2023-04-02 ENCOUNTER — Encounter: Payer: Self-pay | Admitting: Family

## 2023-04-02 ENCOUNTER — Ambulatory Visit (INDEPENDENT_AMBULATORY_CARE_PROVIDER_SITE_OTHER): Payer: 59 | Admitting: Family

## 2023-04-02 VITALS — BP 126/68 | HR 108 | Ht 72.0 in | Wt 340.6 lb

## 2023-04-02 DIAGNOSIS — Z0130 Encounter for examination of blood pressure without abnormal findings: Secondary | ICD-10-CM

## 2023-04-02 DIAGNOSIS — J449 Chronic obstructive pulmonary disease, unspecified: Secondary | ICD-10-CM

## 2023-04-02 DIAGNOSIS — R7303 Prediabetes: Secondary | ICD-10-CM

## 2023-04-02 DIAGNOSIS — E559 Vitamin D deficiency, unspecified: Secondary | ICD-10-CM

## 2023-05-06 ENCOUNTER — Encounter: Payer: Self-pay | Admitting: Family

## 2023-05-06 DIAGNOSIS — E559 Vitamin D deficiency, unspecified: Secondary | ICD-10-CM | POA: Insufficient documentation

## 2023-05-06 DIAGNOSIS — R7303 Prediabetes: Secondary | ICD-10-CM | POA: Insufficient documentation

## 2023-05-06 NOTE — Assessment & Plan Note (Signed)
 Checking labs today.  Continue current therapy for lipid control. Will modify as needed based on labwork results.

## 2023-05-06 NOTE — Assessment & Plan Note (Signed)
 Patient stable.  Well controlled with current therapy.   Continue current meds.

## 2023-05-06 NOTE — Assessment & Plan Note (Signed)
 Checking labs today.  Will continue supplements as needed.

## 2023-05-06 NOTE — Assessment & Plan Note (Signed)
 A1C Continues to be in prediabetic ranges.  Will reassess at follow up after next lab check.  Patient counseled on dietary choices and verbalized understanding.

## 2023-05-06 NOTE — Assessment & Plan Note (Signed)
 Patient is seen by Psychiatry, who manage this condition.  He is well controlled with current therapy.   Will defer to them for further changes to plan of care.

## 2023-05-06 NOTE — Assessment & Plan Note (Signed)
 Continue current meds.  Will adjust as needed based on results.  The patient is asked to make an attempt to improve diet and exercise patterns to aid in medical management of this problem. Addressed importance of increasing and maintaining water intake.

## 2023-05-09 DIAGNOSIS — G4733 Obstructive sleep apnea (adult) (pediatric): Secondary | ICD-10-CM | POA: Diagnosis not present

## 2023-05-10 DIAGNOSIS — J449 Chronic obstructive pulmonary disease, unspecified: Secondary | ICD-10-CM | POA: Diagnosis not present

## 2023-06-06 ENCOUNTER — Ambulatory Visit (INDEPENDENT_AMBULATORY_CARE_PROVIDER_SITE_OTHER): Admitting: Family

## 2023-06-06 ENCOUNTER — Encounter: Payer: Self-pay | Admitting: Oncology

## 2023-06-06 ENCOUNTER — Ambulatory Visit
Admission: RE | Admit: 2023-06-06 | Discharge: 2023-06-06 | Disposition: A | Discharge status: Home / Self Care | Attending: Family | Admitting: Family

## 2023-06-06 ENCOUNTER — Encounter: Payer: Self-pay | Admitting: Family

## 2023-06-06 ENCOUNTER — Ambulatory Visit
Admission: RE | Admit: 2023-06-06 | Discharge: 2023-06-06 | Disposition: A | Discharge status: Home / Self Care | Source: Ambulatory Visit | Attending: Family | Admitting: Family

## 2023-06-06 VITALS — BP 140/82 | HR 104 | Ht 72.0 in | Wt 341.6 lb

## 2023-06-06 DIAGNOSIS — E66813 Obesity, class 3: Secondary | ICD-10-CM

## 2023-06-06 DIAGNOSIS — R0602 Shortness of breath: Secondary | ICD-10-CM | POA: Insufficient documentation

## 2023-06-06 DIAGNOSIS — R0600 Dyspnea, unspecified: Secondary | ICD-10-CM | POA: Diagnosis not present

## 2023-06-06 DIAGNOSIS — R0989 Other specified symptoms and signs involving the circulatory and respiratory systems: Secondary | ICD-10-CM | POA: Diagnosis not present

## 2023-06-06 DIAGNOSIS — G4733 Obstructive sleep apnea (adult) (pediatric): Secondary | ICD-10-CM | POA: Diagnosis not present

## 2023-06-06 DIAGNOSIS — Z6841 Body Mass Index (BMI) 40.0 and over, adult: Secondary | ICD-10-CM | POA: Diagnosis not present

## 2023-06-06 DIAGNOSIS — R058 Other specified cough: Secondary | ICD-10-CM | POA: Diagnosis not present

## 2023-06-06 DIAGNOSIS — Z0130 Encounter for examination of blood pressure without abnormal findings: Secondary | ICD-10-CM

## 2023-06-06 MED ORDER — ZEPBOUND 2.5 MG/0.5ML ~~LOC~~ SOAJ: 2.5000 mg | SUBCUTANEOUS | 0 refills | Status: DC

## 2023-06-08 NOTE — Progress Notes (Signed)
   06/08/2023  Patient ID: Jeffrey Velasquez, male   DOB: 1985-06-04, 38 y.o.   MRN: [id number]  Pharmacy Quality Measure Review  This patient is appearing on a report for being at risk of failing the adherence measure for diabetes medications this calendar year.   Medication: Metformin  Last fill date: 05/21/23 for 30 day supply  Insurance report was not up to date. No action needed at this time.   Jeffrey Velasquez, PharmD Clinical Pharmacist [phone]

## 2023-06-14 ENCOUNTER — Ambulatory Visit: Payer: Self-pay | Admitting: Cardiology

## 2023-06-29 NOTE — Progress Notes (Signed)
   06/29/2023  Patient ID: Jeffrey Velasquez, male   DOB: [birth year], 38 y.o.   MRN: [id number]  Pharmacy Quality Measure Review  This patient is appearing on a report for being at risk of failing the adherence measure for diabetes medications this calendar year.   Medication: Metformin  Last fill date: 06/19/23 for 30 day supply  Insurance report was not up to date. No action needed at this time.   Carnell Christian, PharmD Clinical Pharmacist [phone]

## 2023-07-03 ENCOUNTER — Ambulatory Visit: Admitting: Family

## 2023-07-04 ENCOUNTER — Ambulatory Visit: Admitting: Family

## 2023-07-23 ENCOUNTER — Other Ambulatory Visit: Payer: Self-pay | Admitting: Family

## 2023-07-23 DIAGNOSIS — G4733 Obstructive sleep apnea (adult) (pediatric): Secondary | ICD-10-CM

## 2023-07-23 DIAGNOSIS — E66813 Obesity, class 3: Secondary | ICD-10-CM

## 2023-07-24 ENCOUNTER — Other Ambulatory Visit: Payer: Self-pay | Admitting: Family

## 2023-08-03 DIAGNOSIS — G4733 Obstructive sleep apnea (adult) (pediatric): Secondary | ICD-10-CM | POA: Diagnosis not present

## 2023-08-05 ENCOUNTER — Encounter: Payer: Self-pay | Admitting: Family

## 2023-08-05 NOTE — Progress Notes (Signed)
 Established Patient Office Visit  Subjective:  Patient ID: Jeffrey Velasquez, male    DOB: 02-03-1985  Age: 38 y.o. MRN: [id number]  Chief Complaint  Patient presents with   Follow-up    1 month follow up    Patient is here today for his 1 month follow up.  He has been feeling fairly well since last appointment.   He does not have additional concerns to discuss today.  Labs are not due today.  He needs refills.   I have reviewed his active problem list, medication list, allergies, notes from last encounter, lab results for his appointment today.      No other concerns at this time.   Past Medical History:  Diagnosis Date   Anxiety    Depression    Hyperlipidemia    Polysubstance abuse (HCC) 09/05/2022   Schizophrenia (HCC)     No past surgical history on file.  Social History   Socioeconomic History   Marital status: Single    Spouse name: Not on file   Number of children: Not on file   Years of education: Not on file   Highest education level: Not on file  Occupational History   Not on file  Tobacco Use   Smoking status: Every Day    Current packs/day: 2.00    Types: Cigarettes   Smokeless tobacco: Never  Vaping Use   Vaping status: Never Used  Substance and Sexual Activity   Alcohol use: Yes   Drug use: Yes    Types: Marijuana    Comment: Daily   Sexual activity: Not on file  Other Topics Concern   Not on file  Social History Narrative   Not on file   Social Drivers of Health   Financial Resource Strain: Low Risk  (10/31/2022)   Received from [facility]   Overall Financial Resource Strain (CARDIA)    Difficulty of Paying Living Expenses: Not hard at all  Food Insecurity: No Food Insecurity (10/31/2022)   Received from [facility]   Hunger Vital Sign    Within the past 12 months, you worried that your food would run out before you got the money to buy more.: Never true    Within the past 12 months,  the food you bought just didn't last and you didn't have money to get more.: Never true  Transportation Needs: No Transportation Needs (10/31/2022)   Received from [facility] Transportation    In the past 12 months, has lack of transportation kept you from medical appointments or from getting medications?: No    Lack of Transportation (Non-Medical): No  Physical Activity: Not on file  Stress: Not on file  Social Connections: Not on file  Intimate Partner Violence: Not on file    No family history on file.  Allergies  Allergen Reactions   Clonazepam    Shellfish Allergy     Review of Systems  All other systems reviewed and are negative.      Objective:   BP 126/68   Pulse (!) 108   Ht 6' (1.829 m)   Wt (!) 340 lb 9.6 oz (154.5 kg)   SpO2 96%   BMI 46.19 kg/m   Vitals:   04/02/23 1327  BP: 126/68  Pulse: (!) 108  Height: 6' (1.829 m)  Weight: (!) 340 lb 9.6 oz (154.5 kg)  SpO2: 96%  BMI (Calculated): 46.18    Physical Exam Vitals  and nursing note reviewed.  Constitutional:      Appearance: Normal appearance. He is normal weight.  Eyes:     Pupils: Pupils are equal, round, and reactive to light.  Cardiovascular:     Rate and Rhythm: Normal rate and regular rhythm.     Pulses: Normal pulses.     Heart sounds: Normal heart sounds.  Pulmonary:     Effort: Pulmonary effort is normal.     Breath sounds: Normal breath sounds.  Neurological:     Mental Status: He is alert.  Psychiatric:        Attention and Perception: Attention normal.        Mood and Affect: Mood normal. Affect is flat.        Behavior: Behavior normal. Behavior is cooperative.        Thought Content: Thought content normal.        Cognition and Memory: Cognition normal.        Judgment: Judgment is impulsive.      No results found for any visits on 04/02/23.  No results found for this or any previous visit (from the past 2160 hours).     Assessment &  Plan COPD, mild (HCC) Patient stable.  Well controlled with current therapy.   Continue current meds.   Vitamin D  deficiency, unspecified Will continue supplements as needed.   Obesity, morbid (HCC) Continue current meds.  Will adjust as needed based on results.  The patient is asked to make an attempt to improve diet and exercise patterns to aid in medical management of this problem. Addressed importance of increasing and maintaining water intake.   Prediabetes A1C Continues to be in prediabetic ranges.  Will reassess at follow up after next lab check.  Patient counseled on dietary choices and verbalized understanding.      Return in about 3 months (around 07/03/2023).   Total time spent: 30 minutes  ALAN CHRISTELLA ARRANT, FNP  04/02/2023   This document may have been prepared by [facility] Voice Recognition software and as such may include unintentional dictation errors.

## 2023-08-05 NOTE — Assessment & Plan Note (Signed)
 Continue current meds.  Will adjust as needed based on results.  The patient is asked to make an attempt to improve diet and exercise patterns to aid in medical management of this problem. Addressed importance of increasing and maintaining water intake.

## 2023-08-05 NOTE — Assessment & Plan Note (Signed)
 Will continue supplements as needed.

## 2023-08-05 NOTE — Assessment & Plan Note (Signed)
 A1C Continues to be in prediabetic ranges.  Will reassess at follow up after next lab check.  Patient counseled on dietary choices and verbalized understanding.

## 2023-08-05 NOTE — Assessment & Plan Note (Signed)
 Patient stable.  Well controlled with current therapy.   Continue current meds.

## 2023-08-14 NOTE — Progress Notes (Signed)
   08/14/2023  Patient ID: Jeffrey Velasquez, male   DOB: 1985-01-30, 38 y.o.   MRN: [id number]  Pharmacy Quality Measure Review  This patient is appearing on a report for being at risk of failing the adherence measure for diabetes medications this calendar year.   Medication: Metformin  Last fill date: 7/16//25 for 30 day supply  Insurance report was not up to date. No action needed at this time.   Jon VEAR Lindau, PharmD Clinical Pharmacist [phone]

## 2023-08-19 NOTE — Progress Notes (Signed)
 Acute Office Visit  Subjective:     Patient ID: Jeffrey Velasquez, male    DOB: [birth year], 38 y.o.   MRN: [id number]  Patient is in today for  Chief Complaint  Patient presents with   Acute Visit    Mucus in lungs and hard time breathing    Shortness of Breath This is a chronic problem. The current episode started more than 1 year ago. The problem occurs intermittently. The problem has been waxing and waning. Associated symptoms include rhinorrhea and wheezing. Nothing aggravates the symptoms. Risk factors include smoking. He has tried beta agonist inhalers for the symptoms. The treatment provided mild relief. His past medical history is significant for COPD.     Review of Systems  HENT:  Positive for rhinorrhea.   Respiratory:  Positive for cough, shortness of breath and wheezing.   All other systems reviewed and are negative.       Objective:    BP (!) 140/82   Pulse (!) 104   Ht 6' (1.829 m)   Wt (!) 341 lb 9.6 oz (154.9 kg)   SpO2 94%   BMI 46.33 kg/m   Physical Exam Pulmonary:     Effort: Pulmonary effort is normal.     Breath sounds: Wheezing present.     No results found for any visits on 06/06/23.  No results found for this or any previous visit (from the past 2160 hours).  Allergies as of 06/06/2023       Reactions   Clonazepam    Shellfish Allergy         Medication List        Accurate as of Jun 06, 2023 11:59 PM. If you have any questions, ask your nurse or doctor.          STOP taking these medications    benztropine  1 MG tablet Commonly known as: COGENTIN  Stopped by: ALAN CHRISTELLA ARRANT       TAKE these medications    ARIPiprazole  30 MG tablet Commonly known as: ABILIFY  Take 30 mg by mouth daily. What changed: Another medication with the same name was removed. Continue taking this medication, and follow the directions you see here. Changed by: ALAN CHRISTELLA ARRANT   Abilify  Maintena 400 MG Prsy prefilled syringe Generic  drug: ARIPiprazole  ER Inject 400 mg into the muscle every 28 (twenty-eight) days. What changed: Another medication with the same name was removed. Continue taking this medication, and follow the directions you see here. Changed by: Shadara Lopez M Nazyia Gaugh   atorvastatin  40 MG tablet Commonly known as: LIPITOR TAKE 1 TABLET BY MOUTH DAILY   Breztri  Aerosphere 160-9-4.8 MCG/ACT Aero inhaler Generic drug: budesonide-glycopyrrolate-formoterol  Inhale 2 puffs into the lungs in the morning and at bedtime.   cloNIDine  0.1 MG tablet Commonly known as: CATAPRES  Take 0.1 mg by mouth 2 (two) times daily.   divalproex  500 MG 24 hr tablet Commonly known as: DEPAKOTE  ER Take 500 mg by mouth daily. What changed: Another medication with the same name was removed. Continue taking this medication, and follow the directions you see here. Changed by: ALAN CHRISTELLA ARRANT   FLUoxetine  20 MG capsule Commonly known as: PROZAC  Take 60 mg by mouth every morning.   fluPHENAZine 2.5 MG tablet Commonly known as: PROLIXIN Take 7.5 mg by mouth 2 (two) times daily.   lisinopril  5 MG tablet Commonly known as: ZESTRIL  TAKE 1 TABLET BY MOUTH DAILY FOR HYPERTENSION   metFORMIN  500 MG tablet Commonly known as: GLUCOPHAGE   Take 500 mg by mouth daily.   pantoprazole  40 MG tablet Commonly known as: PROTONIX  Take 40 mg by mouth 2 (two) times daily.   sildenafil  50 MG tablet Commonly known as: VIAGRA  Take 1 tablet (50 mg total) by mouth daily as needed for erectile dysfunction.   Zepbound  2.5 MG/0.5ML Pen Generic drug: tirzepatide  Inject 2.5 mg into the skin once a week. Started by: ALAN CHRISTELLA ARRANT            Assessment & Plan Shortness of breath Getting x-ray of chest today.  Will call with results.   Obstructive sleep apnea Starting zepbound .  Will work to get PA for patient.   Class 3 severe obesity due to excess calories with serious comorbidity and body mass index (BMI) of 45.0 to 49.9 in  adult Continue current meds.  Will adjust as needed based on results.  The patient is asked to make an attempt to improve diet and exercise patterns to aid in medical management of this problem. Addressed importance of increasing and maintaining water intake.     Return in about 1 month (around 07/07/2023) for F/U.  Total time spent: 20 minutes  ALAN CHRISTELLA ARRANT, FNP  06/06/2023   This document may have been prepared by [facility] Voice Recognition software and as such may include unintentional dictation errors.

## 2023-08-19 NOTE — Assessment & Plan Note (Signed)
 Starting zepbound .  Will work to get PA for patient.

## 2023-08-27 DIAGNOSIS — J449 Chronic obstructive pulmonary disease, unspecified: Secondary | ICD-10-CM | POA: Diagnosis not present

## 2023-09-03 ENCOUNTER — Ambulatory Visit: Admitting: Cardiology

## 2023-09-05 ENCOUNTER — Other Ambulatory Visit: Payer: Self-pay | Admitting: Family

## 2023-09-05 DIAGNOSIS — G4733 Obstructive sleep apnea (adult) (pediatric): Secondary | ICD-10-CM

## 2023-09-05 DIAGNOSIS — Z6841 Body Mass Index (BMI) 40.0 and over, adult: Secondary | ICD-10-CM

## 2023-10-03 ENCOUNTER — Ambulatory Visit: Admitting: Family

## 2023-10-03 ENCOUNTER — Encounter: Payer: Self-pay | Admitting: Family

## 2023-10-03 VITALS — BP 140/90 | HR 97 | Ht 72.0 in | Wt 328.2 lb

## 2023-10-03 DIAGNOSIS — J449 Chronic obstructive pulmonary disease, unspecified: Secondary | ICD-10-CM | POA: Diagnosis not present

## 2023-10-03 DIAGNOSIS — R7303 Prediabetes: Secondary | ICD-10-CM

## 2023-10-03 DIAGNOSIS — R5383 Other fatigue: Secondary | ICD-10-CM

## 2023-10-03 DIAGNOSIS — E785 Hyperlipidemia, unspecified: Secondary | ICD-10-CM | POA: Diagnosis not present

## 2023-10-03 DIAGNOSIS — E559 Vitamin D deficiency, unspecified: Secondary | ICD-10-CM | POA: Diagnosis not present

## 2023-10-03 DIAGNOSIS — Z0130 Encounter for examination of blood pressure without abnormal findings: Secondary | ICD-10-CM

## 2023-10-03 DIAGNOSIS — F250 Schizoaffective disorder, bipolar type: Secondary | ICD-10-CM

## 2023-10-03 DIAGNOSIS — E538 Deficiency of other specified B group vitamins: Secondary | ICD-10-CM | POA: Diagnosis not present

## 2023-10-03 DIAGNOSIS — Z0183 Encounter for blood typing: Secondary | ICD-10-CM | POA: Diagnosis not present

## 2023-10-03 DIAGNOSIS — E66813 Obesity, class 3: Secondary | ICD-10-CM

## 2023-10-03 DIAGNOSIS — G4733 Obstructive sleep apnea (adult) (pediatric): Secondary | ICD-10-CM | POA: Diagnosis not present

## 2023-10-03 DIAGNOSIS — Z6841 Body Mass Index (BMI) 40.0 and over, adult: Secondary | ICD-10-CM

## 2023-10-03 MED ORDER — ZEPBOUND 5 MG/0.5ML ~~LOC~~ SOAJ: 5.0000 mg | SUBCUTANEOUS | 3 refills | Status: AC

## 2023-10-03 NOTE — Assessment & Plan Note (Addendum)
-   lab work will be collected today. - continue taking medications as prescribed. - discussed healthy diet and exercise at least 150 minutes a week.

## 2023-10-03 NOTE — Assessment & Plan Note (Addendum)
-   lab work will be collected today. - administer vitamin supplementation as needed based on lab results.  - will reorder sleep study to be completed.

## 2023-10-03 NOTE — Assessment & Plan Note (Addendum)
labwork

## 2023-10-03 NOTE — Assessment & Plan Note (Addendum)
continue taking medications as prescribed

## 2023-10-03 NOTE — Progress Notes (Signed)
 Established Patient Office Visit  Subjective:  Patient ID: Jeffrey Velasquez, male    DOB: 1985/08/20  Age: 38 y.o. MRN: [id number]  Chief Complaint  Patient presents with   Follow-up    3 month follow up, discuss meds, needs CKD    Patient is here today for his 3 months follow up. He is accompanied by his nurse. He has been feeling fairly well since last appointment.   He does have additional concerns to discuss today including increasing Zepbound  dosage and sleep study still has not been completed; he reports he never heard from previous referral sent for a sleep study. Labs are due today; will collect labs and add on blood typing as patient is interested in knowing his blood type. He needs refills on the Zepbound ; will increase dose to Zepbound  5 mg weekly injection.   I have reviewed his active problem list, medication list, allergies, family history, social history, health maintenance, notes from last encounter, lab results for his appointment today.    Will check UA to check urine micro alb/creat ratio    No other concerns at this time.   Past Medical History:  Diagnosis Date   Anxiety    Depression    Hyperlipidemia    Polysubstance abuse (HCC) 09/05/2022   Schizophrenia (HCC)     No past surgical history on file.  Social History   Socioeconomic History   Marital status: Single    Spouse name: Not on file   Number of children: Not on file   Years of education: Not on file   Highest education level: Not on file  Occupational History   Not on file  Tobacco Use   Smoking status: Every Day    Current packs/day: 2.00    Types: Cigarettes   Smokeless tobacco: Never  Vaping Use   Vaping status: Never Used  Substance and Sexual Activity   Alcohol use: Yes   Drug use: Yes    Types: Marijuana    Comment: Daily   Sexual activity: Not on file  Other Topics Concern   Not on file  Social History Narrative   Not on file   Social Drivers of Health    Financial Resource Strain: Low Risk  (10/31/2022)   Received from [facility]   Overall Financial Resource Strain (CARDIA)    Difficulty of Paying Living Expenses: Not hard at all  Food Insecurity: No Food Insecurity (10/31/2022)   Received from [facility]   Hunger Vital Sign    Within the past 12 months, you worried that your food would run out before you got the money to buy more.: Never true    Within the past 12 months, the food you bought just didn't last and you didn't have money to get more.: Never true  Transportation Needs: No Transportation Needs (10/31/2022)   Received from [facility] - Transportation    In the past 12 months, has lack of transportation kept you from medical appointments or from getting medications?: No    Lack of Transportation (Non-Medical): No  Physical Activity: Not on file  Stress: Not on file  Social Connections: Not on file  Intimate Partner Violence: Not on file    No family history on file.  Allergies  Allergen Reactions   Clonazepam    Shellfish Allergy     Review of Systems  Constitutional:  Negative for malaise/fatigue.  HENT: Negative.    Eyes:  Negative for blurred vision and pain.  Respiratory:  Negative for cough and shortness of breath.   Cardiovascular:  Negative for chest pain, palpitations, claudication and leg swelling.  Gastrointestinal:  Negative for abdominal pain, blood in stool, constipation, diarrhea, nausea and vomiting.  Genitourinary:  Negative for dysuria, frequency and urgency.  Musculoskeletal: Negative.   Skin: Negative.   Neurological:  Negative for dizziness, tingling, sensory change and headaches.  Endo/Heme/Allergies: Negative.   Psychiatric/Behavioral: Negative.         Objective:   BP (!) 140/90   Pulse 97   Ht 6' (1.829 m)   Wt (!) 328 lb 3.2 oz (148.9 kg)   SpO2 95%   BMI 44.51 kg/m   Vitals:   10/03/23 1257  BP: (!)  140/90  Pulse: 97  Height: 6' (1.829 m)  Weight: (!) 328 lb 3.2 oz (148.9 kg)  SpO2: 95%  BMI (Calculated): 44.5    Physical Exam Vitals and nursing note reviewed.  Constitutional:      Appearance: Normal appearance.  HENT:     Head: Normocephalic.  Eyes:     Extraocular Movements: Extraocular movements intact.     Pupils: Pupils are equal, round, and reactive to light.  Cardiovascular:     Rate and Rhythm: Normal rate and regular rhythm.     Pulses: Normal pulses.     Heart sounds: Normal heart sounds. No murmur heard. Pulmonary:     Effort: Pulmonary effort is normal. No respiratory distress.     Breath sounds: Normal breath sounds.  Abdominal:     General: There is no distension.     Tenderness: There is no abdominal tenderness.  Musculoskeletal:        General: No tenderness. Normal range of motion.     Cervical back: Normal range of motion and neck supple.     Right lower leg: No edema.     Left lower leg: No edema.  Feet:     Right foot:     Toenail Condition: Right toenails are long.     Left foot:     Toenail Condition: Left toenails are long.  Skin:    General: Skin is warm and dry.     Coloration: Skin is not jaundiced.     Findings: No erythema.  Neurological:     General: No focal deficit present.     Mental Status: He is alert and oriented to person, place, and time.  Psychiatric:        Mood and Affect: Mood normal. Affect is flat.      No results found for any visits on 10/03/23.  No results found for this or any previous visit (from the past 2160 hours).     Assessment & Plan Hyperlipidemia, unspecified hyperlipidemia type Class 3 severe obesity with body mass index (BMI) of 40.0 to 44.9 in adult Obstructive sleep apnea Prediabetes - lab work will be collected today. - continue taking medications as prescribed. - discussed healthy diet and exercise at least 150 minutes a week.  B12 deficiency due to diet Vitamin D  deficiency,  unspecified Other fatigue - lab work will be collected today. - administer vitamin supplementation as needed based on lab results.  - will reorder sleep study to be completed.  Encounter for blood typing - lab work  COPD, mild (HCC) - continue using medications as prescribed. - encouraged smoking cessation. - follow up with pulmonology as recommended  Schizoaffective disorder, bipolar type (HCC) - continue taking medications as  prescribed.    Return in about 3 months (around 01/02/2024).   Total time spent: 30 minutes  ALAN CHRISTELLA ARRANT, FNP  10/03/2023   This document may have been prepared by [facility] Voice Recognition software and as such may include unintentional dictation errors.

## 2023-10-03 NOTE — Assessment & Plan Note (Addendum)
-   continue using medications as prescribed. - encouraged smoking cessation. - follow up with pulmonology as recommended

## 2023-10-04 LAB — CBC WITH DIFFERENTIAL/PLATELET
Basophils Absolute: 0.1 x10E3/uL (ref 0.0–0.2)
Basos: 1 %
EOS (ABSOLUTE): 0.8 x10E3/uL — ABNORMAL HIGH (ref 0.0–0.4)
Eos: 6 %
Hematocrit: 51.6 % — ABNORMAL HIGH (ref 37.5–51.0)
Hemoglobin: 17 g/dL (ref 13.0–17.7)
Immature Grans (Abs): 0 x10E3/uL (ref 0.0–0.1)
Immature Granulocytes: 0 %
Lymphocytes Absolute: 3.4 x10E3/uL — ABNORMAL HIGH (ref 0.7–3.1)
Lymphs: 25 %
MCH: 31.1 pg (ref 26.6–33.0)
MCHC: 32.9 g/dL (ref 31.5–35.7)
MCV: 95 fL (ref 79–97)
Monocytes Absolute: 0.8 x10E3/uL (ref 0.1–0.9)
Monocytes: 6 %
Neutrophils Absolute: 8.6 x10E3/uL — ABNORMAL HIGH (ref 1.4–7.0)
Neutrophils: 62 %
Platelets: 176 x10E3/uL (ref 150–450)
RBC: 5.46 x10E6/uL (ref 4.14–5.80)
RDW: 12.1 % (ref 11.6–15.4)
WBC: 13.7 x10E3/uL — ABNORMAL HIGH (ref 3.4–10.8)

## 2023-10-04 LAB — LIPID PANEL
Chol/HDL Ratio: 4.3 ratio (ref 0.0–5.0)
Cholesterol, Total: 133 mg/dL (ref 100–199)
HDL: 31 mg/dL — ABNORMAL LOW (ref >39)
LDL Chol Calc (NIH): 73 mg/dL (ref 0–99)
Triglycerides: 167 mg/dL — ABNORMAL HIGH (ref 0–149)
VLDL Cholesterol Cal: 29 mg/dL (ref 5–40)

## 2023-10-04 LAB — CMP14+EGFR
ALT: 52 IU/L — ABNORMAL HIGH (ref 0–44)
AST: 31 IU/L (ref 0–40)
Albumin: 4.6 g/dL (ref 4.1–5.1)
Alkaline Phosphatase: 78 IU/L (ref 47–123)
BUN/Creatinine Ratio: 8 — ABNORMAL LOW (ref 9–20)
BUN: 7 mg/dL (ref 6–20)
Bilirubin Total: 0.3 mg/dL (ref 0.0–1.2)
CO2: 20 mmol/L (ref 20–29)
Calcium: 10 mg/dL (ref 8.7–10.2)
Chloride: 102 mmol/L (ref 96–106)
Creatinine, Ser: 0.83 mg/dL (ref 0.76–1.27)
Globulin, Total: 2.1 g/dL (ref 1.5–4.5)
Glucose: 108 mg/dL — ABNORMAL HIGH (ref 70–99)
Potassium: 4.6 mmol/L (ref 3.5–5.2)
Sodium: 138 mmol/L (ref 134–144)
Total Protein: 6.7 g/dL (ref 6.0–8.5)
eGFR: 114 mL/min/1.73 (ref >59)

## 2023-10-04 LAB — HEMOGLOBIN A1C
Est. average glucose Bld gHb Est-mCnc: 114 mg/dL
Hgb A1c MFr Bld: 5.6 % (ref 4.8–5.6)

## 2023-10-04 LAB — ABO AND RH: Rh Factor: POSITIVE

## 2023-10-04 LAB — VITAMIN B12: Vitamin B-12: 619 pg/mL (ref 232–1245)

## 2023-10-04 LAB — TSH: TSH: 1.14 u[IU]/mL (ref 0.450–4.500)

## 2023-10-04 LAB — VITAMIN D 25 HYDROXY (VIT D DEFICIENCY, FRACTURES): Vit D, 25-Hydroxy: 26.9 ng/mL — ABNORMAL LOW (ref 30.0–100.0)

## 2023-11-01 DIAGNOSIS — G4733 Obstructive sleep apnea (adult) (pediatric): Secondary | ICD-10-CM | POA: Diagnosis not present

## 2023-11-02 ENCOUNTER — Encounter: Admitting: Family

## 2023-11-18 NOTE — Progress Notes (Signed)
 This encounter was created in error - please disregard.

## 2023-12-04 ENCOUNTER — Other Ambulatory Visit: Payer: Self-pay | Admitting: Internal Medicine

## 2024-01-01 ENCOUNTER — Ambulatory Visit: Admitting: Family

## 2024-01-17 ENCOUNTER — Other Ambulatory Visit: Payer: Self-pay | Admitting: Family

## 2024-01-28 ENCOUNTER — Other Ambulatory Visit: Payer: Self-pay | Admitting: Internal Medicine

## 2024-02-04 NOTE — Progress Notes (Signed)
 Jeffrey Velasquez                                          MRN: [id number]   02/04/2024   The [facility] Quality Team Specialist reviewed this patient medical record for the purposes of chart review for care gap closure. The following were reviewed: abstraction for care gap closure-glycemic status assessment.    [facility] Quality Team
